# Patient Record
Sex: Male | Born: 1955 | Race: White | Hispanic: No | Marital: Married | Smoking: Former smoker
Health system: Southern US, Community
[De-identification: ages and names within clinical notes are randomized; demographics above are authoritative.]

## PROBLEM LIST (undated history)

## (undated) DIAGNOSIS — D649 Anemia, unspecified: Secondary | ICD-10-CM

## (undated) DIAGNOSIS — I2510 Atherosclerotic heart disease of native coronary artery without angina pectoris: Secondary | ICD-10-CM

## (undated) DIAGNOSIS — B353 Tinea pedis: Secondary | ICD-10-CM

## (undated) DIAGNOSIS — I5032 Chronic diastolic (congestive) heart failure: Secondary | ICD-10-CM

## (undated) DIAGNOSIS — I219 Acute myocardial infarction, unspecified: Secondary | ICD-10-CM

## (undated) DIAGNOSIS — I10 Essential (primary) hypertension: Secondary | ICD-10-CM

## (undated) DIAGNOSIS — R7303 Prediabetes: Secondary | ICD-10-CM

## (undated) HISTORY — DX: Prediabetes: R73.03

## (undated) HISTORY — DX: Atherosclerotic heart disease of native coronary artery without angina pectoris: I25.10

## (undated) HISTORY — DX: Anemia, unspecified: D64.9

## (undated) HISTORY — DX: Morbid (severe) obesity due to excess calories: E66.01

## (undated) HISTORY — DX: Chronic diastolic (congestive) heart failure: I50.32

---

## 2001-11-18 ENCOUNTER — Emergency Department (HOSPITAL_COMMUNITY): Admission: EM | Admit: 2001-11-18 | Discharge: 2001-11-18 | Payer: Self-pay | Admitting: Emergency Medicine

## 2005-06-03 ENCOUNTER — Ambulatory Visit: Payer: Self-pay | Admitting: Internal Medicine

## 2005-06-13 ENCOUNTER — Ambulatory Visit: Payer: Self-pay | Admitting: Internal Medicine

## 2005-06-13 ENCOUNTER — Encounter (INDEPENDENT_AMBULATORY_CARE_PROVIDER_SITE_OTHER): Payer: Self-pay | Admitting: Internal Medicine

## 2005-06-13 ENCOUNTER — Ambulatory Visit (HOSPITAL_COMMUNITY): Admission: RE | Admit: 2005-06-13 | Discharge: 2005-06-13 | Payer: Self-pay | Admitting: Internal Medicine

## 2007-07-29 ENCOUNTER — Ambulatory Visit (HOSPITAL_COMMUNITY): Admission: RE | Admit: 2007-07-29 | Discharge: 2007-07-29 | Payer: Self-pay | Admitting: Family Medicine

## 2009-10-31 ENCOUNTER — Emergency Department (HOSPITAL_COMMUNITY): Admission: EM | Admit: 2009-10-31 | Discharge: 2009-10-31 | Payer: Self-pay | Admitting: Emergency Medicine

## 2009-11-16 ENCOUNTER — Emergency Department (HOSPITAL_COMMUNITY): Admission: EM | Admit: 2009-11-16 | Discharge: 2009-11-16 | Payer: Self-pay | Admitting: Emergency Medicine

## 2010-08-30 NOTE — Consult Note (Signed)
NAME:  Dakota Odom, Dakota Odom                      ACCOUNT NO.:  [id number]   MEDICAL RECORD NO.:  [id number]          PATIENT TYPE:   LOCATION:                                 FACILITY:   PHYSICIAN:  Lionel December, M.D.    DATE OF BIRTH:  06/03/2005   DATE OF CONSULTATION:  DATE OF DISCHARGE:                                   CONSULTATION   REFERRING PHYSICIAN:  Mila Homer. Sudie Bailey, M.D.   HISTORY OF PRESENT ILLNESS:  Shigeru is a 55 year old, Caucasian gentleman  with a couple month history of intermittent hematochezia.  Recently, he was  having hematochezia on a daily basis for a couple of weeks.  He has not seen  any blood per rectum for a week, however.  He denies any constipation,  diarrhea, rectal pain or abdominal pain.  There is no nausea or vomiting.  He has intermittent heartburn generally once a month for which he takes  Tums.  He has never had a colonoscopy.  He recently had three negative stool  Hemoccult cards.   CURRENT MEDICATIONS:  1.  Chantix dose pack.  2.  Gemfibrozil 600 mg b.i.d.  3.  Vytorin 10/40 mg daily.  4.  Allegra 180 mg daily.  5.  Wellbutrin SR 150 mg b.i.d.  6.  Aspirin 81 mg daily.  7.  Tums p.r.n.   ALLERGIES:  No known drug allergies.   PAST MEDICAL HISTORY:  1.  Hypercholesterolemia.  2.  Seasonal allergies.  3.  Tobacco abuse.  4.  Left inguinal hernia repair.   FAMILY HISTORY:  Negative for colorectal cancer or chronic GI illnesses.   SOCIAL HISTORY:  He is married and has two children.  He is employed with  [employer].  He has been trying to quit smoking x2 years.  He use to  smoke four packs a day and is down to half pack a day.  He has not smoked  any in 24 hours.  No alcohol use.   REVIEW OF SYSTEMS:  GASTROINTESTINAL:  See HPI.  CONSTITUTIONAL:  No  unintentional weight loss.  CARDIOPULMONARY:  No chest pain or shortness of  breath.   PHYSICAL EXAMINATION:  VITAL SIGNS:  Weight 224 pounds, height 5 feet 6  inches, temperature 98.4, blood  pressure 132/80, pulse 78.  GENERAL:  Pleasant, moderately obese, Caucasian male in no acute distress.  SKIN:  Warm and dry, no jaundice.  HEENT:  Conjunctivae are pink.  Sclerae nonicteric.  Oropharyngeal mucosa  moist and pink.  No lesions, erythema or exudate.  No lymphadenopathy or  thyromegaly.  CHEST:  Lungs are clear to auscultation.  CARDIAC:  Regular rate and rhythm.  Normal S1, S2.  No murmurs, rubs or  gallops.  ABDOMEN:  Positive bowel sounds, obese, but symmetrical.  Soft.  Nontender.  No organomegaly or masses.  No rebound tenderness or guarding.  No abdominal  bruits or hernias.  EXTREMITIES:  No edema.   IMPRESSION:  Intermittent, chronic hematochezia possibly due to anorectal  source such as hemorrhoids.  He has never had a  colonoscopy and would  recommend diagnostic colonoscopy at this time.  I have discussed risks,  alternatives and benefits with the patient and he is agreeable to proceed.   RECOMMENDATIONS:  1.  Colonoscopy in the near future.  2.  Hold aspirin 4 days prior to procedure.      Tana Coast, P.A.      Lionel December, M.D.  Electronically Signed    LL/MEDQ  D:  06/03/2005  T:  [birth year]03/2005  Job:  [id number]   cc:   Mila Homer. Sudie Bailey, M.D.  Fax: [phone]   Lionel December, M.D.  [address]  [place]  [place] [address]

## 2010-08-30 NOTE — Op Note (Signed)
NAME:  Dakota Odom, Dakota Odom                  ACCOUNT NO.:  [id number]   MEDICAL RECORD NO.:  [id number]          PATIENT TYPE:  AMB   LOCATION:  DAY                           FACILITY:  [facility]   PHYSICIAN:  Lionel December, M.D.    DATE OF BIRTH:  [birth year]   DATE OF PROCEDURE:  06/13/2005  DATE OF DISCHARGE:                                 OPERATIVE REPORT   PROCEDURE:  Colonoscopy.   INDICATIONS:  Dakota Odom is a 55 year old Caucasian male with intermittent  hematochezia. It is possibly related to hemorrhoids, but since this is a  recurrent problem, he is undergoing this exam to make sure he does not have  a polyp or other lesion to account for his hematochezia. Procedure and risks  were reviewed with the patient, and informed consent was obtained.   MEDICINES FOR CONSCIOUS SEDATION:  Demerol 50 mg IV Versed 5 mg IV.   FINDINGS:  Procedure performed in endoscopy suite. The patient's vital signs  and O2 saturations were monitored during procedure and remained stable. The  patient was placed in left lateral recumbent position and rectal examination  performed. No abnormality noted on external or digital exam. Olympus  videoscope was placed into the rectum and advanced under vision into sigmoid  colon and beyond. Preparation was excellent. There is a small polyp at  proximal sigmoid colon which was ablated by cold biopsy.   Scope was passed into cecum which was identified by appendiceal orifice and  ileocecal valve. Pictures were taken for the record. As the scope was  withdrawn, colonic mucosa was, once again, carefully examined and was normal  throughout. Rectal mucosa similarly was normal. Scope was retroflexed to  examine anorectal junction and small hemorrhoids were noted below the  dentate line. Endoscope was straightened and withdrawn. The patient  tolerated the procedure well.   FINAL DIAGNOSIS:  1.  Small polyp ablated via cold biopsy from sigmoid colon.  2.  Small external hemorrhoids,  the possible source of intermittent      hematochezia.   RECOMMENDATIONS:  1.  High-fiber diet.  2.  I will be contacting the patient with biopsy results and further      recommendations if any.      Lionel December, M.D.  Electronically Signed     NR/MEDQ  D:  06/13/2005  T:  06/13/2005  Job:  [id number]   cc:   Mila Homer. Sudie Bailey, M.D.  Fax: [phone]

## 2011-11-06 IMAGING — CR DG HAND COMPLETE 3+V*L*
3 series · 3 of 3 positions shown · non-contrast
Comparison: None.

CLINICAL DATA: Evaluate for foreign body

LEFT HAND - COMPLETE 3+ VIEW

[view not recorded (1 of 3)]
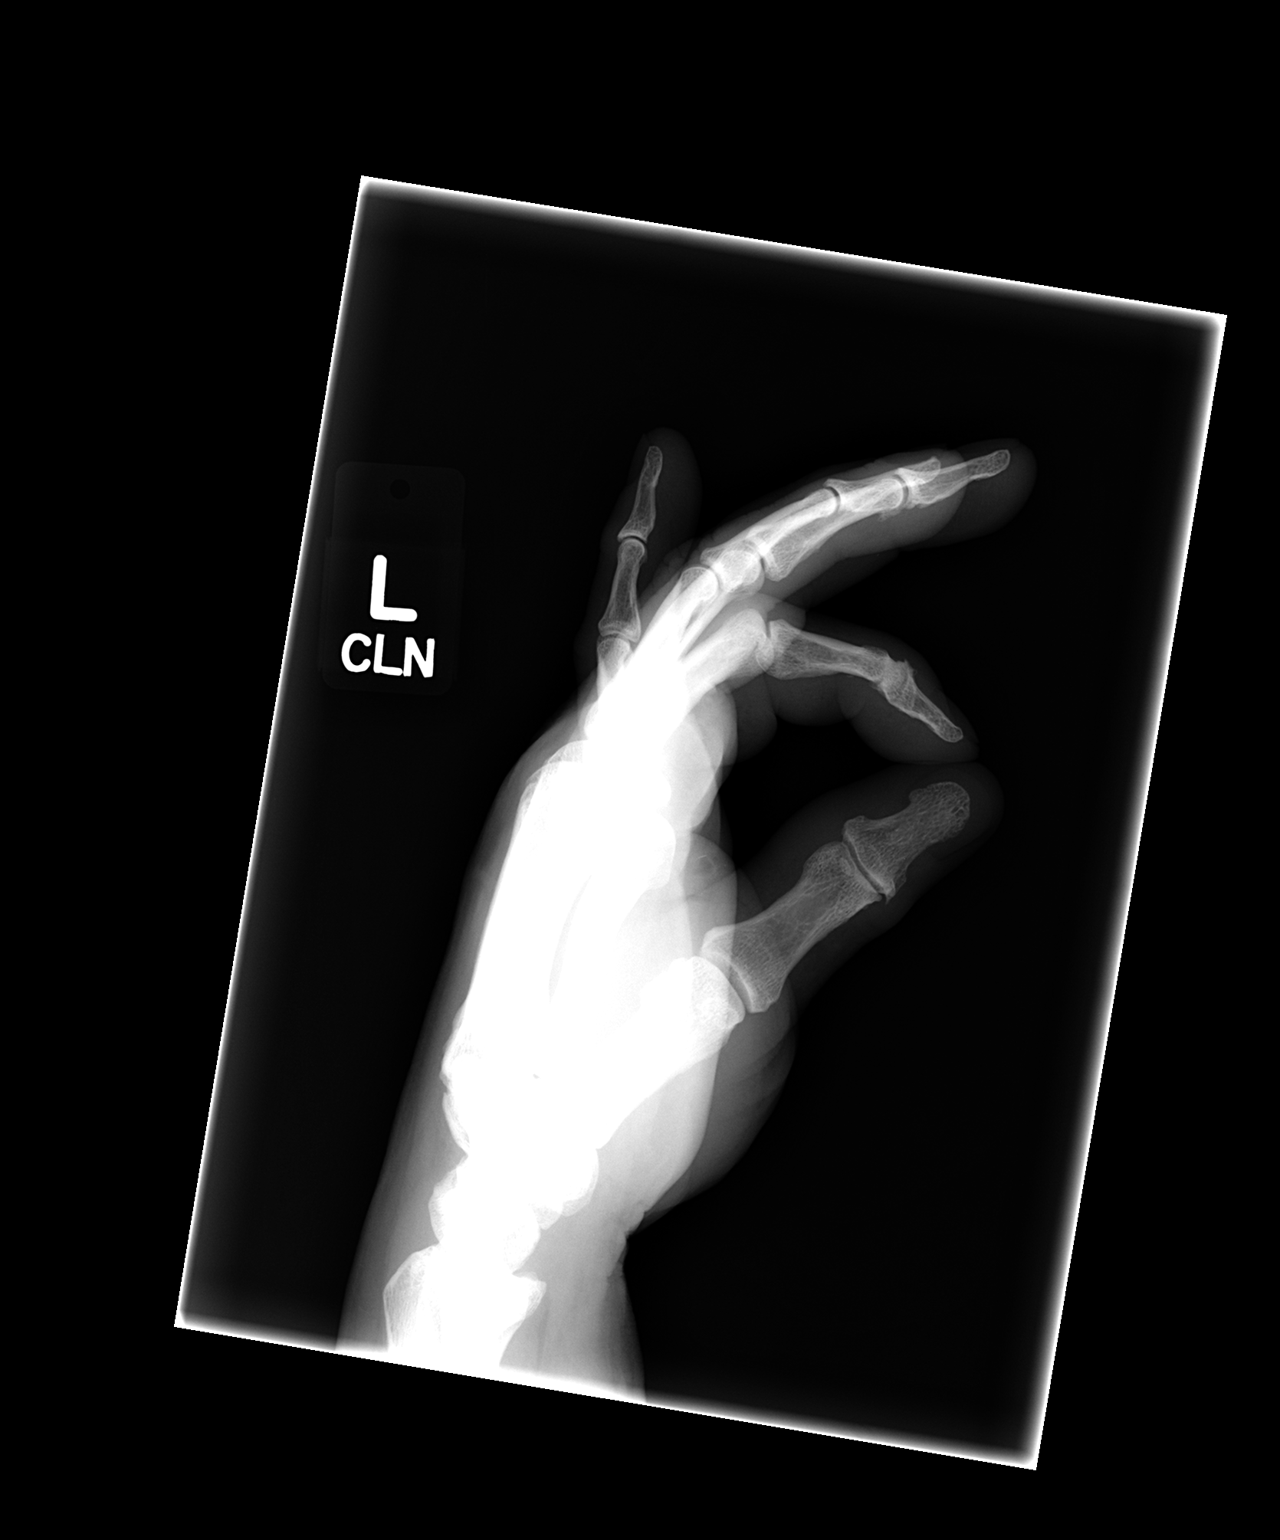

[view not recorded (2 of 3)]
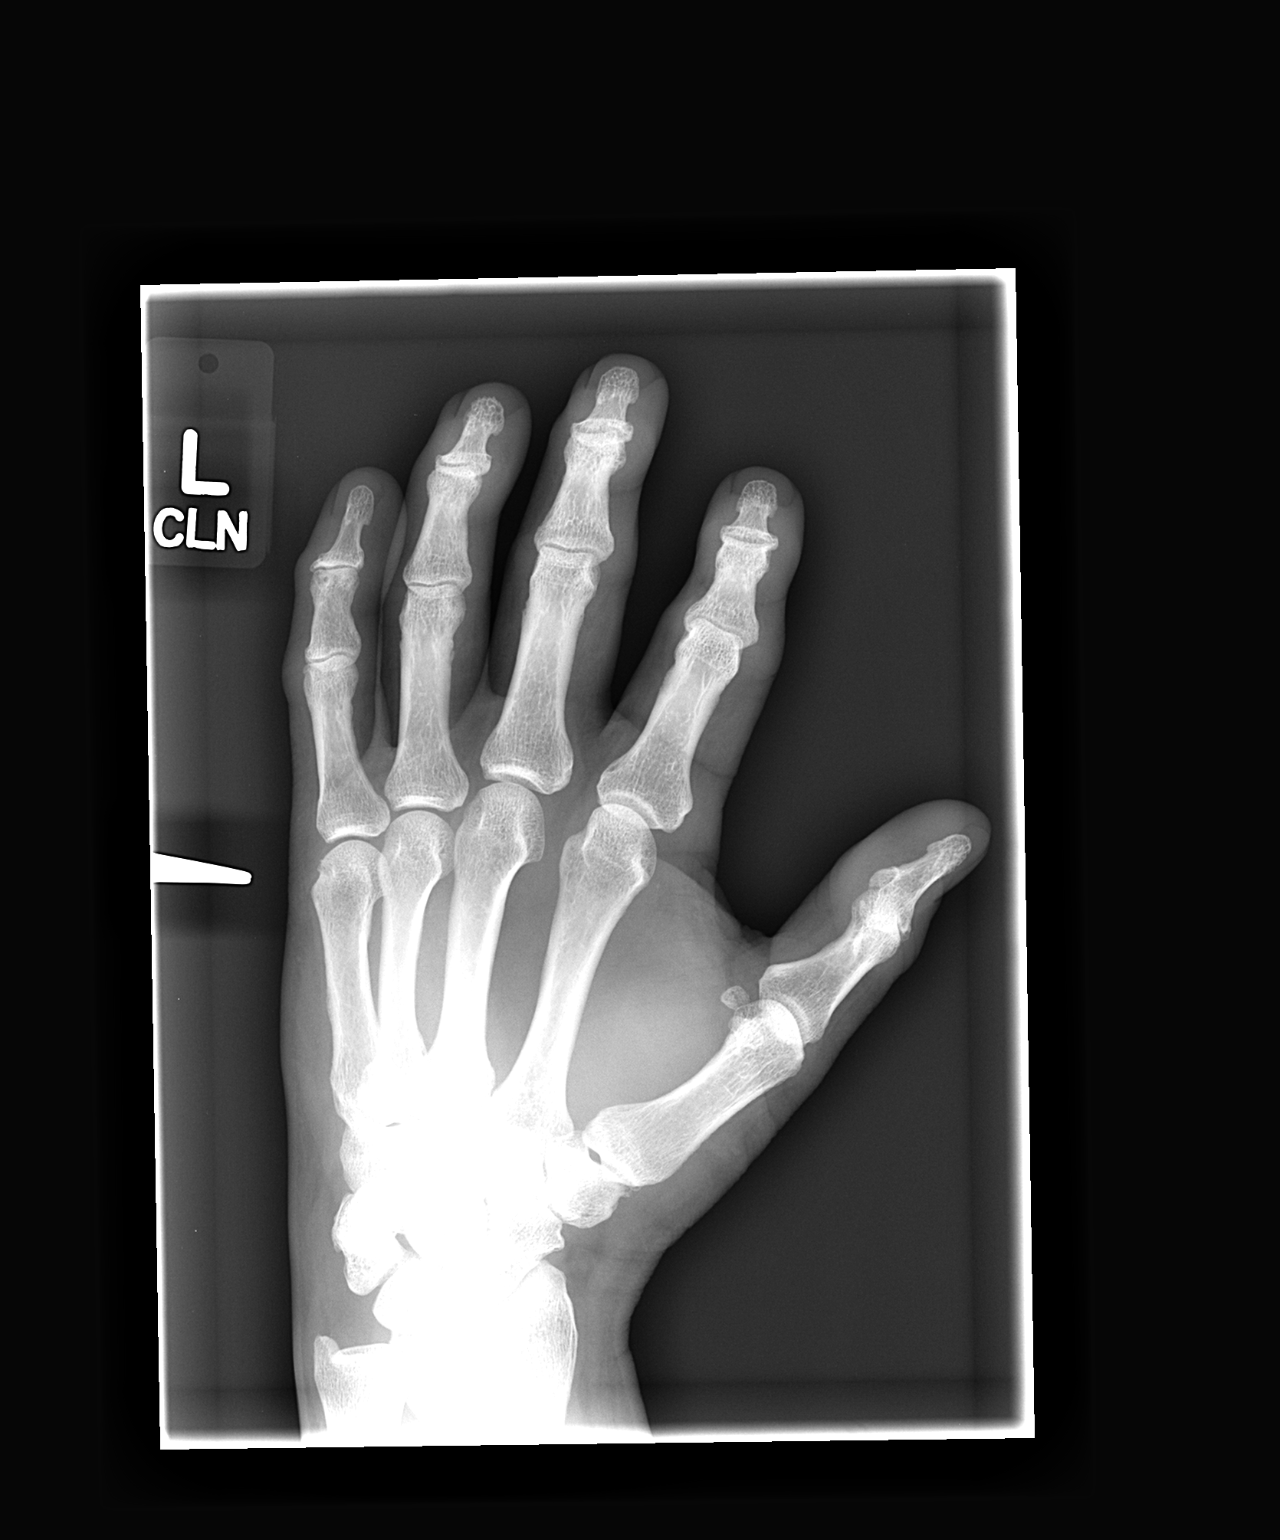

[view not recorded (3 of 3)]
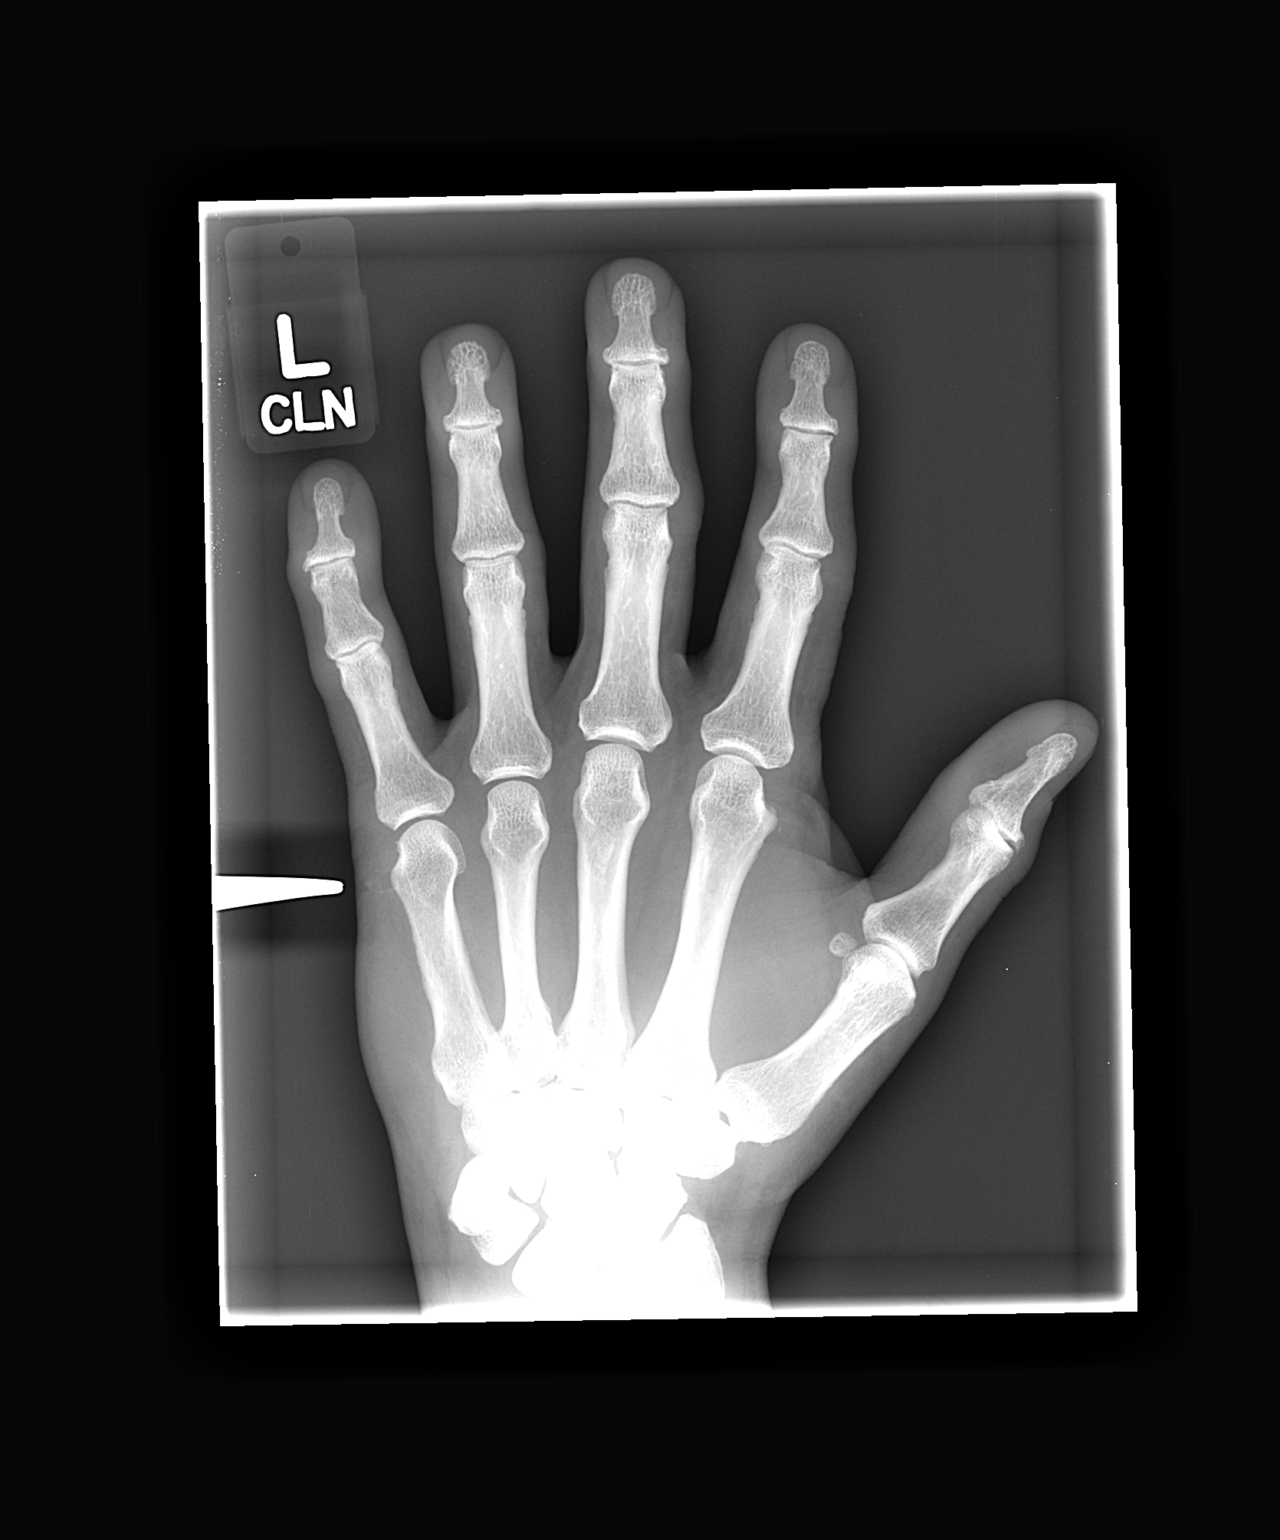

[3 of 3 positions shown; findings below may reference images not displayed]

FINDINGS: There is no acute fracture or malalignment.  A soft
tissue defect is seen projecting over the lateral aspect of the
fifth metacarpal head.  No radiopaque foreign body is seen.
IMPRESSION: Soft tissue defect overlying the fifth metacarpal without
radiopaque foreign body.

## 2012-06-11 ENCOUNTER — Encounter (INDEPENDENT_AMBULATORY_CARE_PROVIDER_SITE_OTHER): Payer: Self-pay | Admitting: *Deleted

## 2021-04-04 ENCOUNTER — Inpatient Hospital Stay (HOSPITAL_COMMUNITY)
Admission: EM | Admit: 2021-04-04 | Discharge: 2021-04-06 | DRG: 247 | Disposition: A | Discharge status: Home / Self Care | Payer: BC Managed Care – PPO | Attending: Cardiovascular Disease | Admitting: Cardiovascular Disease

## 2021-04-04 ENCOUNTER — Emergency Department (HOSPITAL_COMMUNITY): Payer: BC Managed Care – PPO

## 2021-04-04 ENCOUNTER — Other Ambulatory Visit: Payer: Self-pay

## 2021-04-04 ENCOUNTER — Encounter (HOSPITAL_COMMUNITY)
Admission: EM | Disposition: A | Discharge status: Home / Self Care | Payer: Self-pay | Source: Home / Self Care | Attending: Cardiovascular Disease

## 2021-04-04 ENCOUNTER — Other Ambulatory Visit (HOSPITAL_COMMUNITY): Payer: Self-pay

## 2021-04-04 ENCOUNTER — Inpatient Hospital Stay (HOSPITAL_COMMUNITY): Payer: BC Managed Care – PPO

## 2021-04-04 ENCOUNTER — Encounter (HOSPITAL_COMMUNITY): Payer: Self-pay

## 2021-04-04 DIAGNOSIS — Z955 Presence of coronary angioplasty implant and graft: Secondary | ICD-10-CM

## 2021-04-04 DIAGNOSIS — I959 Hypotension, unspecified: Secondary | ICD-10-CM | POA: Diagnosis not present

## 2021-04-04 DIAGNOSIS — R0689 Other abnormalities of breathing: Secondary | ICD-10-CM | POA: Diagnosis not present

## 2021-04-04 DIAGNOSIS — I2111 ST elevation (STEMI) myocardial infarction involving right coronary artery: Secondary | ICD-10-CM | POA: Diagnosis not present

## 2021-04-04 DIAGNOSIS — Z6841 Body Mass Index (BMI) 40.0 and over, adult: Secondary | ICD-10-CM | POA: Diagnosis not present

## 2021-04-04 DIAGNOSIS — F17210 Nicotine dependence, cigarettes, uncomplicated: Secondary | ICD-10-CM | POA: Diagnosis not present

## 2021-04-04 DIAGNOSIS — E785 Hyperlipidemia, unspecified: Secondary | ICD-10-CM | POA: Diagnosis present

## 2021-04-04 DIAGNOSIS — E669 Obesity, unspecified: Secondary | ICD-10-CM | POA: Diagnosis not present

## 2021-04-04 DIAGNOSIS — Z20822 Contact with and (suspected) exposure to covid-19: Secondary | ICD-10-CM | POA: Diagnosis present

## 2021-04-04 DIAGNOSIS — I213 ST elevation (STEMI) myocardial infarction of unspecified site: Secondary | ICD-10-CM | POA: Diagnosis present

## 2021-04-04 DIAGNOSIS — R7303 Prediabetes: Secondary | ICD-10-CM | POA: Diagnosis not present

## 2021-04-04 DIAGNOSIS — E782 Mixed hyperlipidemia: Secondary | ICD-10-CM | POA: Diagnosis not present

## 2021-04-04 DIAGNOSIS — I2510 Atherosclerotic heart disease of native coronary artery without angina pectoris: Secondary | ICD-10-CM | POA: Diagnosis not present

## 2021-04-04 DIAGNOSIS — R0902 Hypoxemia: Secondary | ICD-10-CM | POA: Diagnosis not present

## 2021-04-04 DIAGNOSIS — Z720 Tobacco use: Secondary | ICD-10-CM

## 2021-04-04 DIAGNOSIS — I10 Essential (primary) hypertension: Secondary | ICD-10-CM | POA: Diagnosis not present

## 2021-04-04 HISTORY — PX: CORONARY/GRAFT ACUTE MI REVASCULARIZATION: CATH118305

## 2021-04-04 HISTORY — DX: Essential (primary) hypertension: I10

## 2021-04-04 HISTORY — PX: LEFT HEART CATH AND CORONARY ANGIOGRAPHY: CATH118249

## 2021-04-04 LAB — COMPREHENSIVE METABOLIC PANEL
ALT: 38 U/L (ref 0–44)
AST: 53 U/L — ABNORMAL HIGH (ref 15–41)
Albumin: 3.6 g/dL (ref 3.5–5.0)
Alkaline Phosphatase: 70 U/L (ref 38–126)
Anion gap: 10 (ref 5–15)
BUN: 13 mg/dL (ref 8–23)
CO2: 25 mmol/L (ref 22–32)
Calcium: 9.1 mg/dL (ref 8.9–10.3)
Chloride: 103 mmol/L (ref 98–111)
Creatinine, Ser: 0.84 mg/dL (ref 0.61–1.24)
GFR, Estimated: >60 mL/min (ref >60)
Glucose, Bld: 159 mg/dL — ABNORMAL HIGH (ref 70–99)
Potassium: 3.3 mmol/L — ABNORMAL LOW (ref 3.5–5.1)
Sodium: 138 mmol/L (ref 135–145)
Total Bilirubin: 0.4 mg/dL (ref 0.3–1.2)
Total Protein: 7.3 g/dL (ref 6.5–8.1)

## 2021-04-04 LAB — CBC WITH DIFFERENTIAL/PLATELET
Abs Immature Granulocytes: 0.08 10*3/uL — ABNORMAL HIGH (ref 0.00–0.07)
Basophils Absolute: 0.1 10*3/uL (ref 0.0–0.1)
Basophils Relative: 1 %
Eosinophils Absolute: 0.3 10*3/uL (ref 0.0–0.5)
Eosinophils Relative: 2 %
HCT: 45.5 % (ref 39.0–52.0)
Hemoglobin: 14.4 g/dL (ref 13.0–17.0)
Immature Granulocytes: 1 %
Lymphocytes Relative: 28 %
Lymphs Abs: 3.2 10*3/uL (ref 0.7–4.0)
MCH: 26.4 pg (ref 26.0–34.0)
MCHC: 31.6 g/dL (ref 30.0–36.0)
MCV: 83.5 fL (ref 80.0–100.0)
Monocytes Absolute: 1.4 10*3/uL — ABNORMAL HIGH (ref 0.1–1.0)
Monocytes Relative: 12 %
Neutro Abs: 6.3 10*3/uL (ref 1.7–7.7)
Neutrophils Relative %: 56 %
Platelets: 322 10*3/uL (ref 150–400)
RBC: 5.45 MIL/uL (ref 4.22–5.81)
RDW: 14.9 % (ref 11.5–15.5)
WBC: 11.3 10*3/uL — ABNORMAL HIGH (ref 4.0–10.5)
nRBC: 0 % (ref 0.0–0.2)

## 2021-04-04 LAB — ECHOCARDIOGRAM COMPLETE
AR max vel: 2 cm2
AV Area VTI: 2.03 cm2
AV Area mean vel: 2.02 cm2
AV Mean grad: 6 mmHg
AV Peak grad: 11.6 mmHg
Ao pk vel: 1.7 m/s
Height: 66 in
S' Lateral: 3.4 cm
Weight: 4160 oz

## 2021-04-04 LAB — BASIC METABOLIC PANEL
Anion gap: 7 (ref 5–15)
BUN: 14 mg/dL (ref 8–23)
CO2: 24 mmol/L (ref 22–32)
Calcium: 8.6 mg/dL — ABNORMAL LOW (ref 8.9–10.3)
Chloride: 107 mmol/L (ref 98–111)
Creatinine, Ser: 0.93 mg/dL (ref 0.61–1.24)
GFR, Estimated: >60 mL/min (ref >60)
Glucose, Bld: 100 mg/dL — ABNORMAL HIGH (ref 70–99)
Potassium: 4.6 mmol/L (ref 3.5–5.1)
Sodium: 138 mmol/L (ref 135–145)

## 2021-04-04 LAB — RESP PANEL BY RT-PCR (FLU A&B, COVID) ARPGX2
Influenza A by PCR: NEGATIVE
Influenza B by PCR: NEGATIVE
SARS Coronavirus 2 by RT PCR: NEGATIVE

## 2021-04-04 LAB — LIPID PANEL
Cholesterol: 210 mg/dL — ABNORMAL HIGH (ref 0–200)
HDL: 41 mg/dL (ref >40)
LDL Cholesterol: 140 mg/dL — ABNORMAL HIGH (ref 0–99)
Total CHOL/HDL Ratio: 5.1 RATIO
Triglycerides: 143 mg/dL (ref <150)
VLDL: 29 mg/dL (ref 0–40)

## 2021-04-04 LAB — POCT ACTIVATED CLOTTING TIME
Activated Clotting Time: 311 seconds
Activated Clotting Time: 275 seconds

## 2021-04-04 LAB — TROPONIN I (HIGH SENSITIVITY)
Troponin I (High Sensitivity): 1788 ng/L (ref <18)
Troponin I (High Sensitivity): 16238 ng/L (ref <18)

## 2021-04-04 LAB — PROTIME-INR
INR: 1 (ref 0.8–1.2)
Prothrombin Time: 13.1 seconds (ref 11.4–15.2)

## 2021-04-04 LAB — MAGNESIUM
Magnesium: 2 mg/dL (ref 1.7–2.4)
Magnesium: 2 mg/dL (ref 1.7–2.4)

## 2021-04-04 LAB — APTT: aPTT: 29 seconds (ref 24–36)

## 2021-04-04 LAB — TSH: TSH: 1.131 u[IU]/mL (ref 0.350–4.500)

## 2021-04-04 LAB — HIV ANTIBODY (ROUTINE TESTING W REFLEX): HIV Screen 4th Generation wRfx: NONREACTIVE

## 2021-04-04 LAB — HEMOGLOBIN A1C
Hgb A1c MFr Bld: 6.4 % — ABNORMAL HIGH (ref 4.8–5.6)
Hgb A1c MFr Bld: 6.3 % — ABNORMAL HIGH (ref 4.8–5.6)
Mean Plasma Glucose: 136.98 mg/dL
Mean Plasma Glucose: 134.11 mg/dL

## 2021-04-04 LAB — MRSA NEXT GEN BY PCR, NASAL: MRSA by PCR Next Gen: NOT DETECTED

## 2021-04-04 LAB — GLUCOSE, CAPILLARY: Glucose-Capillary: 177 mg/dL — ABNORMAL HIGH (ref 70–99)

## 2021-04-04 SURGERY — CORONARY/GRAFT ACUTE MI REVASCULARIZATION
Anesthesia: LOCAL

## 2021-04-04 MED ORDER — HEPARIN SODIUM (PORCINE) 1000 UNIT/ML IJ SOLN
INTRAMUSCULAR | Status: AC
  Filled 2021-04-04: qty 10

## 2021-04-04 MED ORDER — TICAGRELOR 90 MG PO TABS
90.0000 mg | ORAL_TABLET | Freq: Two times a day (BID) | ORAL | Status: DC
  Administered 2021-04-04 – 2021-04-06 (×4): 90 mg via ORAL
  Filled 2021-04-04 (×4): qty 1

## 2021-04-04 MED ORDER — HYDROMORPHONE HCL 1 MG/ML IJ SOLN
0.5000 mg | Freq: Once | INTRAMUSCULAR | Status: AC
  Administered 2021-04-04: 04:00:00 0.5 mg via INTRAVENOUS
  Filled 2021-04-04: qty 1

## 2021-04-04 MED ORDER — ONDANSETRON HCL 4 MG/2ML IJ SOLN
INTRAMUSCULAR | Status: DC | PRN
  Administered 2021-04-04 (×2): 4 mg via INTRAVENOUS

## 2021-04-04 MED ORDER — HYDRALAZINE HCL 20 MG/ML IJ SOLN: 10.0000 mg | INTRAMUSCULAR | Status: AC | PRN

## 2021-04-04 MED ORDER — SODIUM CHLORIDE 0.9 % IV SOLN
INTRAVENOUS | Status: AC | PRN
  Administered 2021-04-04: 10 mL/h via INTRAVENOUS

## 2021-04-04 MED ORDER — ONDANSETRON HCL 4 MG/2ML IJ SOLN
INTRAMUSCULAR | Status: AC
  Filled 2021-04-04: qty 2

## 2021-04-04 MED ORDER — SODIUM CHLORIDE 0.9% FLUSH
3.0000 mL | Freq: Two times a day (BID) | INTRAVENOUS | Status: DC
  Administered 2021-04-04 – 2021-04-05 (×4): 3 mL via INTRAVENOUS

## 2021-04-04 MED ORDER — ASPIRIN 81 MG PO CHEW
81.0000 mg | CHEWABLE_TABLET | Freq: Every day | ORAL | Status: DC
  Administered 2021-04-04 – 2021-04-06 (×3): 81 mg via ORAL
  Filled 2021-04-04 (×3): qty 1

## 2021-04-04 MED ORDER — ACETAMINOPHEN 325 MG PO TABS: 650.0000 mg | ORAL_TABLET | ORAL | Status: DC | PRN

## 2021-04-04 MED ORDER — SODIUM CHLORIDE 0.9 % IV SOLN: INTRAVENOUS | Status: AC

## 2021-04-04 MED ORDER — ONDANSETRON HCL 4 MG/2ML IJ SOLN: 4.0000 mg | Freq: Four times a day (QID) | INTRAMUSCULAR | Status: DC | PRN

## 2021-04-04 MED ORDER — HEPARIN (PORCINE) IN NACL 1000-0.9 UT/500ML-% IV SOLN
INTRAVENOUS | Status: DC | PRN
  Administered 2021-04-04 (×4): 500 mL

## 2021-04-04 MED ORDER — ATORVASTATIN CALCIUM 80 MG PO TABS
80.0000 mg | ORAL_TABLET | Freq: Every day | ORAL | Status: DC
  Administered 2021-04-04 – 2021-04-06 (×3): 80 mg via ORAL
  Filled 2021-04-04 (×3): qty 1

## 2021-04-04 MED ORDER — CANGRELOR BOLUS VIA INFUSION
INTRAVENOUS | Status: DC | PRN
  Administered 2021-04-04: 06:00:00 3537 ug via INTRAVENOUS

## 2021-04-04 MED ORDER — ASPIRIN 81 MG PO CHEW
324.0000 mg | CHEWABLE_TABLET | Freq: Once | ORAL | Status: AC
  Administered 2021-04-04: 04:00:00 324 mg via ORAL
  Filled 2021-04-04: qty 4

## 2021-04-04 MED ORDER — SODIUM CHLORIDE 0.9 % IV SOLN: 250.0000 mL | INTRAVENOUS | Status: DC | PRN

## 2021-04-04 MED ORDER — LIDOCAINE HCL (PF) 1 % IJ SOLN
INTRAMUSCULAR | Status: DC | PRN
  Administered 2021-04-04: 2 mL

## 2021-04-04 MED ORDER — ASPIRIN EC 81 MG PO TBEC
81.0000 mg | DELAYED_RELEASE_TABLET | Freq: Every day | ORAL | Status: DC
  Filled 2021-04-04: qty 1

## 2021-04-04 MED ORDER — NOREPINEPHRINE BITARTRATE 1 MG/ML IV SOLN
INTRAVENOUS | Status: AC | PRN
  Administered 2021-04-04: 06:00:00 5 ug/min via INTRAVENOUS

## 2021-04-04 MED ORDER — SODIUM CHLORIDE 0.9 % IV SOLN
4.0000 ug/kg/min | INTRAVENOUS | Status: DC
  Administered 2021-04-04: 08:00:00 4 ug/kg/min via INTRAVENOUS
  Filled 2021-04-04 (×2): qty 50

## 2021-04-04 MED ORDER — SODIUM CHLORIDE 0.9% FLUSH: 3.0000 mL | INTRAVENOUS | Status: DC | PRN

## 2021-04-04 MED ORDER — TICAGRELOR 90 MG PO TABS
180.0000 mg | ORAL_TABLET | Freq: Once | ORAL | Status: AC
  Administered 2021-04-04: 09:00:00 180 mg via ORAL
  Filled 2021-04-04: qty 2

## 2021-04-04 MED ORDER — NITROGLYCERIN 0.4 MG SL SUBL: 0.4000 mg | SUBLINGUAL_TABLET | SUBLINGUAL | Status: DC | PRN

## 2021-04-04 MED ORDER — SODIUM CHLORIDE 0.9 % IV SOLN
INTRAVENOUS | Status: AC | PRN
  Administered 2021-04-04: 06:00:00 4 ug/kg/min via INTRAVENOUS

## 2021-04-04 MED ORDER — MORPHINE SULFATE (PF) 2 MG/ML IV SOLN: 2.0000 mg | INTRAVENOUS | Status: DC | PRN

## 2021-04-04 MED ORDER — VERAPAMIL HCL 2.5 MG/ML IV SOLN
INTRAVENOUS | Status: AC
  Filled 2021-04-04: qty 2

## 2021-04-04 MED ORDER — SODIUM CHLORIDE 0.9 % IV BOLUS
1000.0000 mL | Freq: Once | INTRAVENOUS | Status: AC
  Administered 2021-04-04: 04:00:00 1000 mL via INTRAVENOUS

## 2021-04-04 MED ORDER — VERAPAMIL HCL 2.5 MG/ML IV SOLN
INTRA_ARTERIAL | Status: DC | PRN
  Administered 2021-04-04: 05:00:00 10 mL via INTRA_ARTERIAL

## 2021-04-04 MED ORDER — SODIUM CHLORIDE 0.9 % IV SOLN: INTRAVENOUS | Status: DC

## 2021-04-04 MED ORDER — HEPARIN (PORCINE) IN NACL 1000-0.9 UT/500ML-% IV SOLN
INTRAVENOUS | Status: AC
  Filled 2021-04-04: qty 1000

## 2021-04-04 MED ORDER — LABETALOL HCL 5 MG/ML IV SOLN: 10.0000 mg | INTRAVENOUS | Status: AC | PRN

## 2021-04-04 MED ORDER — NICOTINE 14 MG/24HR TD PT24
14.0000 mg | MEDICATED_PATCH | Freq: Every day | TRANSDERMAL | Status: DC
  Administered 2021-04-04 – 2021-04-06 (×3): 14 mg via TRANSDERMAL
  Filled 2021-04-04 (×3): qty 1

## 2021-04-04 MED ORDER — TICAGRELOR 90 MG PO TABS
ORAL_TABLET | ORAL | Status: AC
  Filled 2021-04-04: qty 1

## 2021-04-04 MED ORDER — CANGRELOR TETRASODIUM 50 MG IV SOLR
INTRAVENOUS | Status: AC
  Filled 2021-04-04: qty 50

## 2021-04-04 MED ORDER — TICAGRELOR 90 MG PO TABS: 90.0000 mg | ORAL_TABLET | Freq: Two times a day (BID) | ORAL | Status: DC

## 2021-04-04 MED ORDER — METOPROLOL SUCCINATE ER 25 MG PO TB24
25.0000 mg | ORAL_TABLET | Freq: Every day | ORAL | Status: DC
  Administered 2021-04-04 – 2021-04-06 (×3): 25 mg via ORAL
  Filled 2021-04-04 (×3): qty 1

## 2021-04-04 MED ORDER — TICAGRELOR 90 MG PO TABS
ORAL_TABLET | ORAL | Status: DC | PRN
  Administered 2021-04-04: 180 mg via ORAL

## 2021-04-04 MED ORDER — ALPRAZOLAM 0.5 MG PO TABS
0.5000 mg | ORAL_TABLET | Freq: Three times a day (TID) | ORAL | Status: DC | PRN
  Administered 2021-04-04 – 2021-04-06 (×3): 0.5 mg via ORAL
  Filled 2021-04-04 (×3): qty 1

## 2021-04-04 MED ORDER — POTASSIUM CHLORIDE CRYS ER 20 MEQ PO TBCR
40.0000 meq | EXTENDED_RELEASE_TABLET | Freq: Once | ORAL | Status: AC
  Administered 2021-04-04: 11:00:00 40 meq via ORAL
  Filled 2021-04-04: qty 2

## 2021-04-04 MED ORDER — HEPARIN SODIUM (PORCINE) 5000 UNIT/ML IJ SOLN
4000.0000 [IU] | Freq: Once | INTRAMUSCULAR | Status: AC
  Administered 2021-04-04: 04:00:00 4000 [IU] via INTRAVENOUS
  Filled 2021-04-04: qty 1

## 2021-04-04 MED ORDER — LIDOCAINE HCL (PF) 1 % IJ SOLN
INTRAMUSCULAR | Status: AC
  Filled 2021-04-04: qty 30

## 2021-04-04 MED ORDER — ATROPINE SULFATE 1 MG/10ML IJ SOSY
PREFILLED_SYRINGE | INTRAMUSCULAR | Status: DC | PRN
  Administered 2021-04-04 (×2): .5 mg via INTRAVENOUS

## 2021-04-04 MED ORDER — NOREPINEPHRINE 4 MG/250ML-% IV SOLN
INTRAVENOUS | Status: AC
  Filled 2021-04-04: qty 250

## 2021-04-04 MED ORDER — CHLORHEXIDINE GLUCONATE CLOTH 2 % EX PADS
6.0000 | MEDICATED_PAD | Freq: Every day | CUTANEOUS | Status: DC
  Administered 2021-04-04 – 2021-04-05 (×2): 6 via TOPICAL

## 2021-04-04 MED ORDER — HEPARIN SODIUM (PORCINE) 1000 UNIT/ML IJ SOLN
INTRAMUSCULAR | Status: DC | PRN
  Administered 2021-04-04: 12000 [IU] via INTRAVENOUS

## 2021-04-04 MED ORDER — IOHEXOL 350 MG/ML SOLN
INTRAVENOUS | Status: DC | PRN
  Administered 2021-04-04: 06:00:00 150 mL via INTRA_ARTERIAL

## 2021-04-04 SURGICAL SUPPLY — 20 items
BALLN SAPPHIRE 2.0X12 (BALLOONS) ×3
BALLN SAPPHIRE ~~LOC~~ 3.0X15 (BALLOONS) ×2 IMPLANT
BALLN SAPPHIRE ~~LOC~~ 4.0X10 (BALLOONS) ×2 IMPLANT
BALLOON SAPPHIRE 2.0X12 (BALLOONS) IMPLANT
CATH LAUNCHER 6FR JR4 (CATHETERS) ×2 IMPLANT
CATH OPTITORQUE TIG 4.0 5F (CATHETERS) ×2 IMPLANT
DEVICE RAD COMP TR BAND LRG (VASCULAR PRODUCTS) ×2 IMPLANT
GLIDESHEATH SLEND A-KIT 6F 22G (SHEATH) ×2 IMPLANT
GUIDEWIRE INQWIRE 1.5J.035X260 (WIRE) IMPLANT
INQWIRE 1.5J .035X260CM (WIRE) ×3
KIT ENCORE 26 ADVANTAGE (KITS) ×2 IMPLANT
KIT HEART LEFT (KITS) ×3 IMPLANT
PACK CARDIAC CATHETERIZATION (CUSTOM PROCEDURE TRAY) ×3 IMPLANT
STENT ONYX FRONTIER 2.75X22 (Permanent Stent) ×2 IMPLANT
STENT ONYX FRONTIER 3.0X12 (Permanent Stent) ×2 IMPLANT
STENT ONYX FRONTIER 3.5X15 (Permanent Stent) ×2 IMPLANT
TRANSDUCER W/STOPCOCK (MISCELLANEOUS) ×3 IMPLANT
TUBING CIL FLEX 10 FLL-RA (TUBING) ×3 IMPLANT
WIRE ASAHI PROWATER 180CM (WIRE) ×2 IMPLANT
WIRE HI TORQ VERSACORE-J 145CM (WIRE) ×2 IMPLANT

## 2021-04-04 NOTE — ED Provider Notes (Signed)
65 yo M with chest pain woke him from sleep, found to have a STEMI on ecg, sent here for cath.  Patient feeling much better on arrival, had one episode of emesis enroute.   Cards at bedside at arrival, will take to the lab.    Melene Plan, DO 04/04/21 [phone]

## 2021-04-04 NOTE — TOC Benefit Eligibility Note (Signed)
Patient Product/process development scientist completed.    The patient is currently admitted and upon discharge could be taking Brilinta 90 mg.  The current 30 day co-pay is, $35.00.   The patient is insured through [employer] of [facility]     Roland Earl, CPhT Pharmacy Patient Advocate Specialist [facility] Patient Advocate Team Direct Number: [phone]  Fax: [phone]

## 2021-04-04 NOTE — Progress Notes (Signed)
Date and time results received: 04/04/21 0838 (use smartphrase ".now" to insert current time)  Test: Troponin Critical Value: 16,238  Name of Provider Notified: Dr. Allyson Sabal  Orders Received? Or Actions Taken?: Expected Value. No new orders.

## 2021-04-04 NOTE — ED Triage Notes (Signed)
Chest pain that started an hour ago center of chest

## 2021-04-04 NOTE — Progress Notes (Signed)
°  Echocardiogram 2D Echocardiogram has been performed.  Roosvelt Maser F 04/04/2021, 1:50 PM

## 2021-04-04 NOTE — ED Provider Notes (Signed)
[facility] Emergency Department Provider Note MRN:  [id number]  Arrival date & time: 04/04/21     Chief Complaint   Chest Pain   History of Present Illness   Dakota Odom is a 65 y.o. year-old male with a history of hypertension, obesity, tobacco abuse presenting to the ED with chief complaint of chest pain.  Severe chest pain starting 1 hour ago, associated with shortness of breath.  No cardiac history.  Denies drugs or alcohol.  Review of Systems  A complete 10 system review of systems was obtained and all systems are negative except as noted in the HPI and PMH.   Patient's Health History    Past Medical History:  Diagnosis Date   Hypertension       History reviewed. No pertinent family history.  Social History   Socioeconomic History   Marital status: Married    Spouse name: Not on file   Number of children: Not on file   Years of education: Not on file   Highest education level: Not on file  Occupational History   Not on file  Tobacco Use   Smoking status: Every Day    Packs/day: 0.50    Years: 20.00    Pack years: 10.00    Types: Cigarettes   Smokeless tobacco: Never  Substance and Sexual Activity   Alcohol use: Not on file   Drug use: Never   Sexual activity: Not on file  Other Topics Concern   Not on file  Social History Narrative   Not on file   Social Determinants of Health   Financial Resource Strain: Not on file  Food Insecurity: Not on file  Transportation Needs: Not on file  Physical Activity: Not on file  Stress: Not on file  Social Connections: Not on file  Intimate Partner Violence: Not on file     Physical Exam   Vitals:   04/04/21 0400 04/04/21 0402  BP: 105/76 105/76  Pulse: 89 79  Resp: 18 20  Temp: 98.2 F (36.8 C)   SpO2: 98% 94%    CONSTITUTIONAL: Ill-appearing, in moderate distress due to pain NEURO:  Alert and oriented x 3, no focal deficits EYES:  eyes equal and reactive ENT/NECK:  no LAD,  no JVD CARDIO: Regular rate, well-perfused, normal S1 and S2 PULM:  CTAB no wheezing or rhonchi GI/GU:  normal bowel sounds, non-distended, non-tender MSK/SPINE:  No gross deformities, no edema SKIN:  no rash, atraumatic PSYCH:  Appropriate speech and behavior  *Additional and/or pertinent findings included in MDM below  Diagnostic and Interventional Summary    EKG Interpretation  Date/Time:  Thursday April 04 2021 03:59:03 EST Ventricular Rate:  87 PR Interval:  212 QRS Duration: 107 QT Interval:  377 QTC Calculation: 454 R Axis:   73 Text Interpretation: Sinus rhythm Atrial premature complex Borderline prolonged PR interval Inferior infarct, acute (RCA) Probable anterolateral infarct, acute Probable RV involvement, suggest recording right precordial leads Baseline wander in lead(s) I III aVR aVL >>> Acute MI <<< No old tracing to compare Confirmed by Dione Booze ([id number]) on 04/04/2021 4:25:58 AM       Labs Reviewed  CBC WITH DIFFERENTIAL/PLATELET - Abnormal; Notable for the following components:      Result Value   WBC 11.3 (*)    Monocytes Absolute 1.4 (*)    Abs Immature Granulocytes 0.08 (*)    All other components within normal limits  RESP PANEL BY RT-PCR (FLU A&B, COVID) ARPGX2  HEMOGLOBIN A1C  PROTIME-INR  APTT  COMPREHENSIVE METABOLIC PANEL  LIPID PANEL  TROPONIN I (HIGH SENSITIVITY)    DG Chest Port 1 View    (Results Pending)    Medications  0.9 %  sodium chloride infusion ( Intravenous New Bag/Given 04/04/21 0416)  aspirin chewable tablet 324 mg (324 mg Oral Given 04/04/21 0404)  heparin injection 4,000 Units (4,000 Units Intravenous Given 04/04/21 0410)  HYDROmorphone (DILAUDID) injection 0.5 mg (0.5 mg Intravenous Given 04/04/21 0404)  sodium chloride 0.9 % bolus 1,000 mL (1,000 mLs Intravenous New Bag/Given 04/04/21 0409)     Procedures  /  Critical Care .Critical Care Performed by: Sabas Sous, MD Authorized by: Sabas Sous, MD    Critical care provider statement:    Critical care time (minutes):  40   Critical care was necessary to treat or prevent imminent or life-threatening deterioration of the following conditions: ST elevation myocardial infarction.   Critical care was time spent personally by me on the following activities:  Development of treatment plan with patient or surrogate, discussions with consultants, evaluation of patient's response to treatment, examination of patient, ordering and review of laboratory studies, ordering and review of radiographic studies, ordering and performing treatments and interventions, pulse oximetry, re-evaluation of patient's condition and review of old charts  ED Course and Medical Decision Making  I have reviewed the triage vital signs, the nursing notes, and pertinent available records from the EMR.  Listed above are laboratory and imaging tests that I personally ordered, reviewed, and interpreted and then considered in my medical decision making (see below for details).  Chest pain, multiple cardiovascular risk factors but no known history of CAD.  EKG with marked ST elevation inferiorly with reciprocal depression consistent with acute inferior MI.  Code STEMI initiated.  Given aspirin, heparin, Dilaudid for pain.  Heart rate in the 70s, blood pressure 107/76, will hold off on nitroglycerin for now discussed with Dr. Gery Pray of cardiology, who accepts patient urgently for Cath Lab.  Reached out to [facility] emergency department, and Dr. Preston Fleeting accepts patient for ED to ED transfer in case Cath Lab is not ready for patient upon arrival.       Elmer Sow. Pilar Plate, MD [facility] Emergency Medicine [facility] [email or web address]  Final Clinical Impressions(s) / ED Diagnoses     ICD-10-CM   1. ST elevation myocardial infarction (STEMI), unspecified artery (HCC)  I21.3       ED Discharge Orders     None        Discharge Instructions Discussed with and  Provided to Patient:   Discharge Instructions   None       Sabas Sous, MD 04/04/21 [phone]

## 2021-04-04 NOTE — H&P (Signed)
Cardiology Admission History and Physical:   Patient ID: Dakota Odom MRN: [id number]; DOB: Aug 27, 1955   Admission date: 04/04/2021  PCP:  Pcp, No   [facility] Providers Cardiologist:  None        Chief Complaint:  CP/STEMI  Patient Profile:   Dakota Odom is a 65 y.o. male with no significant prior cardiac hx who is being seen 04/04/2021 for the evaluation of CP and EKG changes suggestive of STEMI.  History of Present Illness:   Dakota Odom has no prior cardiac hx but has h/o HTN, obesity and tobacco abuse and presented to the ED at [facility] c/o CP that has been going on now for about 2-3 hours. Onset was sudden, random and woke him from sleep, as a discomfort or heaviness in the center of his chest that was severe and unrelenting. It was associated with SOB. Pt states he has not seen a doctor "in years" and takes no medications at home. EKG obtained by the [facility] shows >54mm ST elevation in the inferior leads w/ reciprocal depression laterally. STEMI activated and pt transferred to [facility] for urgent/emergent LHC.   Past Medical History:  Diagnosis Date   Hypertension     Medications Prior to Admission: Prior to Admission medications   Not on File   No active OP meds  Allergies:   No Known Allergies  Social History:   Social History   Socioeconomic History   Marital status: Married    Spouse name: Not on file   Number of children: Not on file   Years of education: Not on file   Highest education level: Not on file  Occupational History   Not on file  Tobacco Use   Smoking status: Every Day    Packs/day: 0.50    Years: 20.00    Pack years: 10.00    Types: Cigarettes   Smokeless tobacco: Never  Substance and Sexual Activity   Alcohol use: Not on file   Drug use: Never   Sexual activity: Not on file  Other Topics Concern   Not on file  Social History Narrative   Not on file   Social Determinants of Health   Financial Resource Strain: Not on file  Food Insecurity:  Not on file  Transportation Needs: Not on file  Physical Activity: Not on file  Stress: Not on file  Social Connections: Not on file  Intimate Partner Violence: Not on file    Family History:  non-contributory The patient's family history is not on file.    ROS:  Please see the history of present illness.  All other ROS reviewed and negative.     Physical Exam/Data:   Vitals:   04/04/21 0400 04/04/21 0402  BP: 105/76 105/76  Pulse: 89 79  Resp: 18 20  Temp: 98.2 F (36.8 C)   TempSrc: Oral   SpO2: 98% 94%  Weight:  117.9 kg  Height:  5\' 6"  (1.676 m)   No intake or output data in the 24 hours ending 04/04/21 0441 Last 3 Weights 04/04/2021  Weight (lbs) 260 lb  Weight (kg) 117.935 kg     Body mass index is 41.97 kg/m.  General:  Well nourished, well developed, in no acute distress HEENT: normal Neck: no JVD Vascular: No carotid bruits; Distal pulses 2+ bilaterally   Cardiac:  normal S1, S2; RRR; no murmur  Lungs:  clear to auscultation bilaterally, no wheezing, rhonchi or rales  Abd: soft, nontender, no hepatomegaly  Ext: no  edema Musculoskeletal:  No deformities Skin: warm and dry  Neuro:  no focal abnormalities noted Psych:  Normal affect    EKG:  The ECG that was done 04-04-21 was personally reviewed and demonstrates NSR with significant >28mm ST elevation in the inferior leads w/ reciprocal lateral ST depression  Relevant CV Studies: none  Laboratory Data:  High Sensitivity Troponin:  No results for input(s): TROPONINIHS in the last 720 hours.    ChemistryNo results for input(s): NA, K, CL, CO2, GLUCOSE, BUN, CREATININE, CALCIUM, MG, GFRNONAA, GFRAA, ANIONGAP in the last 168 hours.  No results for input(s): PROT, ALBUMIN, AST, ALT, ALKPHOS, BILITOT in the last 168 hours. Lipids No results for input(s): CHOL, TRIG, HDL, LABVLDL, LDLCALC, CHOLHDL in the last 168 hours. Hematology Recent Labs  Lab 04/04/21 0400  WBC 11.3*  RBC 5.45  HGB 14.4  HCT 45.5   MCV 83.5  MCH 26.4  MCHC 31.6  RDW 14.9  PLT 322   Thyroid No results for input(s): TSH, FREET4 in the last 168 hours. BNPNo results for input(s): BNP, PROBNP in the last 168 hours.  DDimer No results for input(s): DDIMER in the last 168 hours.   Radiology/Studies:  DG Chest Port 1 View  Result Date: 04/04/2021 CLINICAL DATA:  65 year old male, STEMI. EXAM: PORTABLE CHEST 1 VIEW COMPARISON:  None. FINDINGS: Portable AP upright view at 0412 hours. Pacer or resuscitation pads project over the left lower chest. Normal lung volumes. Cardiac size at the upper limits of normal. Other mediastinal contours are within normal limits. Visualized tracheal air column is within normal limits. No pneumothorax, pulmonary edema, pleural effusion or consolidation. No acute osseous abnormality identified. IMPRESSION: No radiographic cardiopulmonary abnormality. Electronically Signed   By: Odessa Fleming M.D.   On: 04/04/2021 04:37     Assessment and Plan:   CP/inferior STEMI: emergent LHC for further evaluation. Start medical tx w/ ASA, statin, BB. TTE for baseline. A1c, FLP, TSH for risk stratification HTN: unclear home regimen. Will start BB and add additional agents as necessary   Risk Assessment/Risk Scores:    TIMI Risk Score for ST  Elevation MI:   The patient's TIMI risk score is 2, which indicates a 2.2% risk of all cause mortality at 30 days.        Severity of Illness: The appropriate patient status for this patient is INPATIENT. Inpatient status is judged to be reasonable and necessary in order to provide the required intensity of service to ensure the patient's safety. The patient's presenting symptoms, physical exam findings, and initial radiographic and laboratory data in the context of their chronic comorbidities is felt to place them at high risk for further clinical deterioration. Furthermore, it is not anticipated that the patient will be medically stable for discharge from the hospital  within 2 midnights of admission.   * I certify that at the point of admission it is my clinical judgment that the patient will require inpatient hospital care spanning beyond 2 midnights from the point of admission due to high intensity of service, high risk for further deterioration and high frequency of surveillance required.*   For questions or updates, please contact [facility] Please consult [email or web address] for contact info under     Signed, Precious Reel, MD, [facility] 04/04/2021 4:41 AM

## 2021-04-04 NOTE — ED Notes (Signed)
Called [facility] STEMI for transport

## 2021-04-04 NOTE — ED Notes (Signed)
Called [facility]

## 2021-04-04 NOTE — ED Notes (Signed)
Called report to [facility]

## 2021-04-05 ENCOUNTER — Other Ambulatory Visit (HOSPITAL_COMMUNITY): Payer: Self-pay

## 2021-04-05 ENCOUNTER — Encounter (HOSPITAL_COMMUNITY): Payer: Self-pay | Admitting: Cardiovascular Disease

## 2021-04-05 DIAGNOSIS — I10 Essential (primary) hypertension: Secondary | ICD-10-CM

## 2021-04-05 DIAGNOSIS — E782 Mixed hyperlipidemia: Secondary | ICD-10-CM

## 2021-04-05 LAB — BASIC METABOLIC PANEL
Anion gap: 5 (ref 5–15)
BUN: 14 mg/dL (ref 8–23)
CO2: 28 mmol/L (ref 22–32)
Calcium: 8.7 mg/dL — ABNORMAL LOW (ref 8.9–10.3)
Chloride: 105 mmol/L (ref 98–111)
Creatinine, Ser: 0.94 mg/dL (ref 0.61–1.24)
GFR, Estimated: >60 mL/min (ref >60)
Glucose, Bld: 118 mg/dL — ABNORMAL HIGH (ref 70–99)
Potassium: 4.7 mmol/L (ref 3.5–5.1)
Sodium: 138 mmol/L (ref 135–145)

## 2021-04-05 LAB — CBC
HCT: 41.7 % (ref 39.0–52.0)
Hemoglobin: 13.2 g/dL (ref 13.0–17.0)
MCH: 26.6 pg (ref 26.0–34.0)
MCHC: 31.7 g/dL (ref 30.0–36.0)
MCV: 83.9 fL (ref 80.0–100.0)
Platelets: 244 10*3/uL (ref 150–400)
RBC: 4.97 MIL/uL (ref 4.22–5.81)
RDW: 15.2 % (ref 11.5–15.5)
WBC: 12.6 10*3/uL — ABNORMAL HIGH (ref 4.0–10.5)
nRBC: 0 % (ref 0.0–0.2)

## 2021-04-05 LAB — GLUCOSE, CAPILLARY: Glucose-Capillary: 140 mg/dL — ABNORMAL HIGH (ref 70–99)

## 2021-04-05 LAB — LIPID PANEL
Cholesterol: 180 mg/dL (ref 0–200)
HDL: 40 mg/dL — ABNORMAL LOW (ref >40)
LDL Cholesterol: 123 mg/dL — ABNORMAL HIGH (ref 0–99)
Total CHOL/HDL Ratio: 4.5 RATIO
Triglycerides: 85 mg/dL (ref <150)
VLDL: 17 mg/dL (ref 0–40)

## 2021-04-05 LAB — MAGNESIUM: Magnesium: 2.2 mg/dL (ref 1.7–2.4)

## 2021-04-05 MED ORDER — ATORVASTATIN CALCIUM 80 MG PO TABS
80.0000 mg | ORAL_TABLET | Freq: Every day | ORAL | 12 refills | Status: DC
  Filled 2021-04-05 (×2): qty 30, 30d supply, fill #0

## 2021-04-05 MED ORDER — TICAGRELOR 90 MG PO TABS
90.0000 mg | ORAL_TABLET | Freq: Two times a day (BID) | ORAL | 12 refills | Status: DC
  Filled 2021-04-05: qty 60, 30d supply, fill #0

## 2021-04-05 MED ORDER — EMPAGLIFLOZIN 10 MG PO TABS
10.0000 mg | ORAL_TABLET | Freq: Every day | ORAL | Status: DC
  Administered 2021-04-05 – 2021-04-06 (×2): 10 mg via ORAL
  Filled 2021-04-05 (×2): qty 1

## 2021-04-05 MED ORDER — METOPROLOL SUCCINATE ER 25 MG PO TB24
25.0000 mg | ORAL_TABLET | Freq: Every day | ORAL | 12 refills | Status: DC
  Filled 2021-04-05 (×2): qty 30, 30d supply, fill #0

## 2021-04-05 MED ORDER — EMPAGLIFLOZIN 10 MG PO TABS
10.0000 mg | ORAL_TABLET | Freq: Every day | ORAL | 12 refills | Status: DC
  Filled 2021-04-05: qty 30, 30d supply, fill #0

## 2021-04-05 NOTE — Care Management (Signed)
04-05-21 1146 Staff RN to provide patient with Brilinta co pay card. Case Manager will continue to follow for additional transition of care needs.

## 2021-04-05 NOTE — Progress Notes (Signed)
Progress Note  Patient Name: Dakota Odom Date of Encounter: 04/05/2021  [facility] Cardiologist: Dr. Quay Burow  Subjective   Postop day 1 inferior STEMI.  He is completely asymptomatic.  Inpatient Medications    Scheduled Meds:  aspirin  81 mg Oral Daily   atorvastatin  80 mg Oral Daily   Chlorhexidine Gluconate Cloth  6 each Topical Daily   metoprolol succinate  25 mg Oral Daily   nicotine  14 mg Transdermal Daily   sodium chloride flush  3 mL Intravenous Q12H   ticagrelor  90 mg Oral BID   Continuous Infusions:  sodium chloride 20 mL/hr at 04/04/21 0416   sodium chloride     PRN Meds: sodium chloride, acetaminophen, ALPRAZolam, morphine injection, nitroGLYCERIN, ondansetron (ZOFRAN) IV, sodium chloride flush   Vital Signs    Vitals:   04/05/21 0500 04/05/21 0600 04/05/21 0700 04/05/21 0702  BP: 110/88 (!) 120/96  115/82  Pulse: 63 84 95 97  Resp: 18 19 (!) 22 20  Temp:      TempSrc:      SpO2: 93% 94% 92% 94%  Weight:      Height:        Intake/Output Summary (Last 24 hours) at 04/05/2021 0819 Last data filed at 04/05/2021 4650 Gross per 24 hour  Intake 1390.76 ml  Output 1000 ml  Net 390.76 ml   Last 3 Weights 04/04/2021  Weight (lbs) 260 lb  Weight (kg) 117.935 kg      Telemetry    Sinus rhythm, sinus bradycardia with occasional junctional beats and occasional PVCs- Personally Reviewed  ECG    Sinus rhythm 86 with evolving inferior ST segment elevation with biphasic ST waves.- Personally Reviewed  Physical Exam   GEN: No acute distress.   Neck: No JVD Cardiac: RRR, no murmurs, rubs, or gallops.  Respiratory: Clear to auscultation bilaterally. GI: Soft, nontender, non-distended  MS: No edema; No deformity. Neuro:  Nonfocal  Psych: Normal affect   Labs    High Sensitivity Troponin:   Recent Labs  Lab 04/04/21 0400 04/04/21 0654  TROPONINIHS 1,788* 16,238*     Chemistry Recent Labs  Lab 04/04/21 0400 04/04/21 0654  04/04/21 1529 04/05/21 0229  NA 138  --  138 138  K 3.3*  --  4.6 4.7  CL 103  --  107 105  CO2 25  --  24 28  GLUCOSE 159*  --  100* 118*  BUN 13  --  14 14  CREATININE 0.84  --  0.93 0.94  CALCIUM 9.1  --  8.6* 8.7*  MG  --  2.0 2.0 2.2  PROT 7.3  --   --   --   ALBUMIN 3.6  --   --   --   AST 53*  --   --   --   ALT 38  --   --   --   ALKPHOS 70  --   --   --   BILITOT 0.4  --   --   --   GFRNONAA >60  --  >60 >60  ANIONGAP 10  --  7 5    Lipids  Recent Labs  Lab 04/05/21 0229  CHOL 180  TRIG 85  HDL 40*  LDLCALC 123*  CHOLHDL 4.5    Hematology Recent Labs  Lab 04/04/21 0400 04/05/21 0229  WBC 11.3* 12.6*  RBC 5.45 4.97  HGB 14.4 13.2  HCT 45.5 41.7  MCV 83.5 83.9  MCH 26.4 26.6  MCHC 31.6 31.7  RDW 14.9 15.2  PLT 322 244   Thyroid  Recent Labs  Lab 04/04/21 0654  TSH 1.131    BNPNo results for input(s): BNP, PROBNP in the last 168 hours.  DDimer No results for input(s): DDIMER in the last 168 hours.   Radiology    CARDIAC CATHETERIZATION  Result Date: 04/04/2021 Images from the original result were not included.   Ost LM to Mid LM lesion is 70% stenosed.   1st Mrg lesion is 50% stenosed.   1st Diag lesion is 50% stenosed.   Prox RCA lesion is 100% stenosed.   A drug-eluting stent was successfully placed using a STENT Dakota Odom.   Post intervention, there is a 0% residual stenosis. Dakota Odom is a 65 y.o. male  [id number] LOCATION:  FACILITY: [facility]dom PHYSICIAN: Quay Burow, M.D. [birth year] DATE OF PROCEDURE:  04/04/2021 DATE OF DISCHARGE: CARDIAC CATHETERIZATION / PCI DES RCA History obtained from chart review.  Dakota Odom is a 65 year old morbidly overweight married Caucasian male without prior cardiac history who was awakened in the early morning hours with chest pain.  He went to [facility] and was found to have inferior ST segment elevation and was transported urgently to [facility] for cath and intervention. PROCEDURE  DESCRIPTION: The patient was brought to the second floor [place] [facility]iac cath lab in the postabsorptive state. He was not premedicated . His right wrist was prepped and shaved in usual sterile fashion. Xylocaine 1% was used for local anesthesia. A 6 French sheath was inserted into the right radial artery using standard Seldinger technique. The patient received 12,000 units  of heparin intravenously.  A 5 [place] TIG catheter was used for selective coronary angiography and obtain left heart pressures.  Isovue dye was used for the entirety of the case (150 cc of contrast total to patient).  Retrograde aortic, left ventricular and pullback pressures were recorded.  Radial cocktail was administered via the SideArm sheath. The patient received 180 mg of p.o. Brilinta.  The ACT was 311 after his heparin bolus at the beginning of the case and at the end of the case was 275.  Isovue dye was used for the entirety of the intervention.  Retroaortic pressures monitored in the case.  Using a 6 [place] JR4 guide catheter along with 0.14 Prowater guidewire and a 2.5 mm x 12 m balloon the lesion in the proximal dominant RCA was crossed and predilated establishing antegrade flow.  The door to balloon time was 21 minutes.  Patient did become significantly bradycardic requiring intravenous atropine.  He also became hypotensive requiring IV fluid bolus and low-dose Levophed. I then carefully positioned a 2.75 x 22 mm long Medtronic Dakota drug-eluting stent across the disease segment and deployed at 14 to 16 atm.  Postdilated the entire stented segment with a 3 mm balloon.  There did appear to be a distal edge dissection.  I therefore placed a 3 mm x 12 mm long Medtronic Dakota balloon distally overlapping the previous stent and deployed at 16 atm.  There was mismatch between the very proximal portion of the RCA and the stented segment.  I therefore placed a 3.5  mm x 15 mm long Medtronic Dakota overlapping the proximal edge of the stent  and postdilated with a 4 mm x 10 mm noncompliant balloon up to 16 atm resulting in reduction of total occlusion to 0% residual with TIMI-3 flow.  His ST segments  did improve at the end of the case.  He was pain-free upon completion of the case.  The sheath was removed and a TR band was placed on the right wrist to achieve patent hemostasis.  The patient left lab in stable condition.   Successful PCI drug-eluting stenting of an occluded dominant proximal RCA using overlapping Medtronic Dakota drug-eluting stents.  The patient did have vomiting during and at the end of the case.  I was concerned that he may have vomited up his Brilinta and therefore bolused him with cangrelor and put him on a cangrelor drip.  We will reload him with p.o. Brilinta.  He will need uninterrupted DAPT for at least 12 months.  He in addition had a 70% distal left main which was short but no significant disease in the LAD and circumflex.  This will be treated medically at the current time.  2D echo will be ordered.  The patient was placed on high-dose atorvastatin, beta-blocker once his rhythm and heart rate have been documented to be stable. Quay Burow. MD, [facility] 04/04/2021 6:27 AM    DG Chest Port 1 View  Result Date: 04/04/2021 CLINICAL DATA:  65 year old male, STEMI. EXAM: PORTABLE CHEST 1 VIEW COMPARISON:  None. FINDINGS: Portable AP upright view at 0412 hours. Pacer or resuscitation pads project over the left lower chest. Normal lung volumes. Cardiac size at the upper limits of normal. Other mediastinal contours are within normal limits. Visualized tracheal air column is within normal limits. No pneumothorax, pulmonary edema, pleural effusion or consolidation. No acute osseous abnormality identified. IMPRESSION: No radiographic cardiopulmonary abnormality. Electronically Signed   By: Genevie Ann M.D.   On: 04/04/2021 04:37   ECHOCARDIOGRAM COMPLETE  Result Date: 04/04/2021    ECHOCARDIOGRAM REPORT   Patient Name:   Dakota Odom  [place] Date of Exam: 04/04/2021 Medical Rec #:  [id number]    Height:       66.0 in Accession #:    [id number]   Weight:       260.0 lb Date of Birth:  [birth year]    BSA:          2.236 m Patient Age:    11 years     BP:           121/78 mmHg Patient Gender: M            HR:           99 bpm. Exam Location:  Inpatient Procedure: 2D Echo, Cardiac Doppler and Color Doppler Indications:    NSTEMI  History:        Patient has no prior history of Echocardiogram examinations.                 Acute MI and CAD, Cardiac ath today; Arrythmias:PVC and LBBB.  Sonographer:    Merrie Roof RDCS Referring Phys: [place]  1. Left ventricular ejection fraction, by estimation, is 60 to 65%. The left ventricle has normal function. The left ventricle has no regional wall motion abnormalities. There is moderate concentric left ventricular hypertrophy. Left ventricular diastolic function could not be evaluated.  2. Right ventricular systolic function is normal. The right ventricular size is normal.  3. The mitral valve is grossly normal. No evidence of mitral valve regurgitation. No evidence of mitral stenosis.  4. The aortic valve is grossly normal. There is moderate calcification of the aortic valve. There is mild thickening of the aortic valve. Aortic valve regurgitation is trivial. Aortic  valve sclerosis/calcification is present, without any evidence of aortic stenosis.  5. The inferior vena cava is dilated in size with <50% respiratory variability, suggesting right atrial pressure of 15 mmHg. Comparison(s): No prior Echocardiogram. Conclusion(s)/Recommendation(s): Otherwise normal echocardiogram, with minor abnormalities described in the report. Overall normal LVEF. There may be slight hypokinesis of the mid inferior wall, incompletely visualized, but this is trivial. FINDINGS  Left Ventricle: Left ventricular ejection fraction, by estimation, is 60 to 65%. The left ventricle has normal function. The left  ventricle has no regional wall motion abnormalities. The left ventricular internal cavity size was normal in size. There is  moderate concentric left ventricular hypertrophy. Left ventricular diastolic function could not be evaluated. Right Ventricle: The right ventricular size is normal. Right vetricular wall thickness was not well visualized. Right ventricular systolic function is normal. Left Atrium: Left atrial size was normal in size. Right Atrium: Right atrial size was normal in size. Pericardium: There is no evidence of pericardial effusion. Mitral Valve: The mitral valve is grossly normal. No evidence of mitral valve regurgitation. No evidence of mitral valve stenosis. Tricuspid Valve: The tricuspid valve is grossly normal. Tricuspid valve regurgitation is trivial. No evidence of tricuspid stenosis. Aortic Valve: The aortic valve is grossly normal. There is moderate calcification of the aortic valve. There is mild thickening of the aortic valve. Aortic valve regurgitation is trivial. Aortic valve sclerosis/calcification is present, without any evidence of aortic stenosis. Aortic valve mean gradient measures 6.0 mmHg. Aortic valve peak gradient measures 11.6 mmHg. Aortic valve area, by VTI measures 2.03 cm. Pulmonic Valve: The pulmonic valve was not well visualized. Pulmonic valve regurgitation is not visualized. Aorta: The aortic root, ascending aorta, aortic arch and descending aorta are all structurally normal, with no evidence of dilitation or obstruction. Venous: The inferior vena cava is dilated in size with less than 50% respiratory variability, suggesting right atrial pressure of 15 mmHg. IAS/Shunts: The atrial septum is grossly normal.  LEFT VENTRICLE PLAX 2D LVIDd:         5.20 cm LVIDs:         3.40 cm LV PW:         1.30 cm LV IVS:        1.40 cm LVOT diam:     2.30 cm LV SV:         51 LV SV Index:   23 LVOT Area:     4.15 cm  RIGHT VENTRICLE RV Basal diam:  3.90 cm LEFT ATRIUM              Index        RIGHT ATRIUM           Index LA diam:        3.00 cm 1.34 cm/m   RA Area:     15.20 cm LA Vol (A2C):   52.2 ml 23.35 ml/m  RA Volume:   38.80 ml  17.35 ml/m LA Vol (A4C):   47.9 ml 21.43 ml/m LA Biplane Vol: 50.2 ml 22.45 ml/m  AORTIC VALVE AV Area (Vmax):    2.00 cm AV Area (Vmean):   2.02 cm AV Area (VTI):     2.03 cm AV Vmax:           170.00 cm/s AV Vmean:          112.000 cm/s AV VTI:            0.252 m AV Peak Grad:      11.6 mmHg  AV Mean Grad:      6.0 mmHg LVOT Vmax:         81.90 cm/s LVOT Vmean:        54.400 cm/s LVOT VTI:          0.123 m LVOT/AV VTI ratio: 0.49  AORTA Ao Root diam: 3.40 cm Ao Asc diam:  3.40 cm  SHUNTS Systemic VTI:  0.12 m Systemic Diam: 2.30 cm Buford Dresser MD Electronically signed by Buford Dresser MD Signature Date/Time: 04/04/2021/7:11:08 PM    Final     Cardiac Studies   2D echocardiogram (04/04/2021)  IMPRESSIONS     1. Left ventricular ejection fraction, by estimation, is 60 to 65%. The  left ventricle has normal function. The left ventricle has no regional  wall motion abnormalities. There is moderate concentric left ventricular  hypertrophy. Left ventricular  diastolic function could not be evaluated.   2. Right ventricular systolic function is normal. The right ventricular  size is normal.   3. The mitral valve is grossly normal. No evidence of mitral valve  regurgitation. No evidence of mitral stenosis.   4. The aortic valve is grossly normal. There is moderate calcification of  the aortic valve. There is mild thickening of the aortic valve. Aortic  valve regurgitation is trivial. Aortic valve sclerosis/calcification is  present, without any evidence of  aortic stenosis.   5. The inferior vena cava is dilated in size with <50% respiratory  variability, suggesting right atrial pressure of 15 mmHg.  Cardiac catheterization/PCI drug-eluting stent (04/04/2021)  PROCEDURE DESCRIPTION:    The patient was brought  to the second floor [facility] cath lab in the postabsorptive state. He was not premedicated . His right wrist was prepped and shaved in usual sterile fashion. Xylocaine 1% was used for local anesthesia. A 6 French sheath was inserted into the right radial artery using standard Seldinger technique. The patient received 12,000 units  of heparin intravenously.  A 5 [place] TIG catheter was used for selective coronary angiography and obtain left heart pressures.  Isovue dye was used for the entirety of the case (150 cc of contrast total to patient).  Retrograde aortic, left ventricular and pullback pressures were recorded.  Radial cocktail was administered via the SideArm sheath.   The patient received 180 mg of p.o. Brilinta.  The ACT was 311 after his heparin bolus at the beginning of the case and at the end of the case was 275.  Isovue dye was used for the entirety of the intervention.  Retroaortic pressures monitored in the case.  Using a 6 [place] JR4 guide catheter along with 0.14 [place]ater guidewire and a 2.5 mm x 12 m balloon the lesion in the proximal dominant RCA was crossed and predilated establishing antegrade flow.  The door to balloon time was 21 minutes.  Patient did become significantly bradycardic requiring intravenous atropine.  He also became hypotensive requiring IV fluid bolus and low-dose Levophed.  I then carefully positioned a 2.75 x 22 mm long Medtronic Dakota drug-eluting stent across the disease segment and deployed at 14 to 16 atm.  Postdilated the entire stented segment with a 3 mm balloon.  There did appear to be a distal edge dissection.  I therefore placed a 3 mm x 12 mm long Medtronic Dakota balloon distally overlapping the previous stent and deployed at 16 atm.  There was mismatch between the very proximal portion of the RCA and the stented segment.  I therefore placed a 3.5  mm x 15 mm long Medtronic Dakota overlapping the proximal edge of the stent and postdilated with a 4 mm x 10  mm noncompliant balloon up to 16 atm resulting in reduction of total occlusion to 0% residual with TIMI-3 flow.  His ST segments did improve at the end of the case.  He was pain-free upon completion of the case.  The sheath was removed and a TR band was placed on the right wrist to achieve patent hemostasis.  The patient left lab in stable condition.       IMPRESSION: Successful PCI drug-eluting stenting of an occluded dominant proximal RCA using overlapping Medtronic Dakota drug-eluting stents.  The patient did have vomiting during and at the end of the case.  I was concerned that he may have vomited up his Brilinta and therefore bolused him with cangrelor and put him on a cangrelor drip.  We will reload him with p.o. Brilinta.  He will need uninterrupted DAPT for at least 12 months.  He in addition had a 70% distal left main which was short but no significant disease in the LAD and circumflex.  This will be treated medically at the current time.  2D echo will be ordered.  The patient was placed on high-dose atorvastatin, beta-blocker once his rhythm and heart rate have been documented to be stable. Coronary Diagrams  Diagnostic Dominance: Right Intervention    Patient Profile     65 y.o. moderately overweight married Caucasian male who was awakened from sleep with substernal chest pain early yesterday morning.  His only risk factor is hypertension.  He had inferior ST segment elevation and was transported to [facility] where he underwent emergent coronary angiography and intervention.  Assessment & Plan    1: Inferior STEMI-postop day 1 inferior STEMI treated with PCI drug-eluting stenting of the proximal mid occluded dominant RCA.  He did have 70% distal left main which will be treated medically.  He has 3 drug-eluting stents.  He is on DAPT which he will remain on uninterrupted for 12 months.  His LV function is normal.  2: Essential hypertension-blood pressure under good control.  He  is on a beta-blocker  3: Hyperlipidemia-LDL 120.  High-dose statin therapy initiated  4: Prediabetes-hemoglobin A1c 6.3.  We will start an SGLT2 inhibitor.  Patient stable postop day 1 inferior STEMI.  Will transfer to telemetry, ambulate with cardiac rehab with anticipation of "fast-track discharge" tomorrow.  For questions or updates, please contact [place] Please consult [email or web address] for contact info under        Signed, Quay Burow, MD  04/05/2021, 8:19 AM

## 2021-04-05 NOTE — Progress Notes (Signed)
CARDIAC REHAB PHASE I   Just finished walking with nursing staff without difficulty or angina.  Education completed re: MI, stent placement, angina symptoms, NTG usage, when to call 911, when to call the doctor.  Discussed dual platelet medications, activity restrictions, exercise progression, heart healthy nutrition tips, and smoking cessation.  Patient's entire family (spouse, and sons) along with patient use tobacco products and we spent a considerable amount of time concentrating on quitting.   Referred to phase II cardiac rehab @ [facility]. [id number]  Cindra Eves RN, BSN 04/05/2021 10:55 AM

## 2021-04-05 NOTE — Discharge Instructions (Signed)

## 2021-04-06 DIAGNOSIS — I10 Essential (primary) hypertension: Secondary | ICD-10-CM

## 2021-04-06 DIAGNOSIS — Z720 Tobacco use: Secondary | ICD-10-CM

## 2021-04-06 DIAGNOSIS — R7303 Prediabetes: Secondary | ICD-10-CM

## 2021-04-06 DIAGNOSIS — E785 Hyperlipidemia, unspecified: Secondary | ICD-10-CM

## 2021-04-06 MED ORDER — ATORVASTATIN CALCIUM 80 MG PO TABS: 80.0000 mg | ORAL_TABLET | Freq: Every day | ORAL | 3 refills | Status: DC

## 2021-04-06 MED ORDER — METOPROLOL SUCCINATE ER 25 MG PO TB24: 25.0000 mg | ORAL_TABLET | Freq: Every day | ORAL | 11 refills | Status: DC

## 2021-04-06 MED ORDER — EMPAGLIFLOZIN 10 MG PO TABS: 10.0000 mg | ORAL_TABLET | Freq: Every day | ORAL | 12 refills | Status: DC

## 2021-04-06 MED ORDER — NITROGLYCERIN 0.4 MG SL SUBL: 0.4000 mg | SUBLINGUAL_TABLET | SUBLINGUAL | 3 refills | Status: DC | PRN

## 2021-04-06 MED ORDER — TICAGRELOR 90 MG PO TABS: 90.0000 mg | ORAL_TABLET | Freq: Two times a day (BID) | ORAL | 12 refills | Status: DC

## 2021-04-06 MED ORDER — ASPIRIN EC 81 MG PO TBEC: 81.0000 mg | DELAYED_RELEASE_TABLET | Freq: Every day | ORAL | Status: DC

## 2021-04-06 MED ORDER — NITROGLYCERIN 0.4 MG SL SUBL
0.4000 mg | SUBLINGUAL_TABLET | SUBLINGUAL | 3 refills | Status: DC | PRN
  Filled 2021-04-06: qty 25, 1d supply, fill #0

## 2021-04-06 NOTE — Progress Notes (Deleted)
Progress Note  Patient Name: Dakota Odom Date of Encounter: 04/06/2021  [facility] Cardiologist: Dr. Quay Burow  Subjective     Inpatient Medications    Scheduled Meds:  aspirin  81 mg Oral Daily   atorvastatin  80 mg Oral Daily   Chlorhexidine Gluconate Cloth  6 each Topical Daily   empagliflozin  10 mg Oral Daily   metoprolol succinate  25 mg Oral Daily   nicotine  14 mg Transdermal Daily   sodium chloride flush  3 mL Intravenous Q12H   ticagrelor  90 mg Oral BID   Continuous Infusions:  sodium chloride 20 mL/hr at 04/04/21 0416   sodium chloride     PRN Meds: sodium chloride, acetaminophen, ALPRAZolam, morphine injection, nitroGLYCERIN, ondansetron (ZOFRAN) IV, sodium chloride flush   Vital Signs    Vitals:   04/05/21 1551 04/05/21 2111 04/06/21 0626 04/06/21 0818  BP: 136/66 118/70 (!) 141/96 (!) 143/78  Pulse: 92 78 89 92  Resp:  18    Temp:  98 F (36.7 C) 97.7 F (36.5 C)   TempSrc:  Oral Oral   SpO2: 94% 94% 95%   Weight:      Height:        Intake/Output Summary (Last 24 hours) at 04/06/2021 0912 Last data filed at 04/06/2021 0650 Gross per 24 hour  Intake 1207.34 ml  Output 1750 ml  Net -542.66 ml    Last 3 Weights 04/04/2021  Weight (lbs) 260 lb  Weight (kg) 117.935 kg      Telemetry    Sinus rhythm, sinus bradycardia with occasional junctional beats and occasional PVCs- Personally Reviewed  ECG    Sinus rhythm 86 with evolving inferior ST segment elevation with biphasic ST waves.- Personally Reviewed  Physical Exam   GEN: No acute distress.   Neck: No JVD Cardiac: RRR, no murmurs, rubs, or gallops.  Respiratory: Clear to auscultation bilaterally. GI: Soft, nontender, non-distended  MS: No edema; No deformity. Neuro:  Nonfocal  Psych: Normal affect   Labs    High Sensitivity Troponin:   Recent Labs  Lab 04/04/21 0400 04/04/21 0654  TROPONINIHS 1,788* 16,238*      Chemistry Recent Labs  Lab 04/04/21 0400  04/04/21 0654 04/04/21 1529 04/05/21 0229  NA 138  --  138 138  K 3.3*  --  4.6 4.7  CL 103  --  107 105  CO2 25  --  24 28  GLUCOSE 159*  --  100* 118*  BUN 13  --  14 14  CREATININE 0.84  --  0.93 0.94  CALCIUM 9.1  --  8.6* 8.7*  MG  --  2.0 2.0 2.2  PROT 7.3  --   --   --   ALBUMIN 3.6  --   --   --   AST 53*  --   --   --   ALT 38  --   --   --   ALKPHOS 70  --   --   --   BILITOT 0.4  --   --   --   GFRNONAA >60  --  >60 >60  ANIONGAP 10  --  7 5     Lipids  Recent Labs  Lab 04/05/21 0229  CHOL 180  TRIG 85  HDL 40*  LDLCALC 123*  CHOLHDL 4.5     Hematology Recent Labs  Lab 04/04/21 0400 04/05/21 0229  WBC 11.3* 12.6*  RBC 5.45 4.97  HGB 14.4 13.2  HCT 45.5 41.7  MCV 83.5 83.9  MCH 26.4 26.6  MCHC 31.6 31.7  RDW 14.9 15.2  PLT 322 244    Thyroid  Recent Labs  Lab 04/04/21 0654  TSH 1.131     BNPNo results for input(s): BNP, PROBNP in the last 168 hours.  DDimer No results for input(s): DDIMER in the last 168 hours.   Radiology    ECHOCARDIOGRAM COMPLETE  Result Date: 04/04/2021    ECHOCARDIOGRAM REPORT   Patient Name:   Dakota Odom Date of Exam: 04/04/2021 Medical Rec #:  [id number]    Height:       66.0 in Accession #:    [id number]   Weight:       260.0 lb Date of Birth:  [birth year]    BSA:          2.236 m Patient Age:    65 years     BP:           121/78 mmHg Patient Gender: M            HR:           99 bpm. Exam Location:  Inpatient Procedure: 2D Echo, Cardiac Doppler and Color Doppler Indications:    NSTEMI  History:        Patient has no prior history of Echocardiogram examinations.                 Acute MI and CAD, Cardiac ath today; Arrythmias:PVC and LBBB.  Sonographer:    Merrie Roof RDCS Referring Phys: Jerome  1. Left ventricular ejection fraction, by estimation, is 60 to 65%. The left ventricle has normal function. The left ventricle has no regional wall motion abnormalities. There is moderate concentric  left ventricular hypertrophy. Left ventricular diastolic function could not be evaluated.  2. Right ventricular systolic function is normal. The right ventricular size is normal.  3. The mitral valve is grossly normal. No evidence of mitral valve regurgitation. No evidence of mitral stenosis.  4. The aortic valve is grossly normal. There is moderate calcification of the aortic valve. There is mild thickening of the aortic valve. Aortic valve regurgitation is trivial. Aortic valve sclerosis/calcification is present, without any evidence of aortic stenosis.  5. The inferior vena cava is dilated in size with <50% respiratory variability, suggesting right atrial pressure of 15 mmHg. Comparison(s): No prior Echocardiogram. Conclusion(s)/Recommendation(s): Otherwise normal echocardiogram, with minor abnormalities described in the report. Overall normal LVEF. There may be slight hypokinesis of the mid inferior wall, incompletely visualized, but this is trivial. FINDINGS  Left Ventricle: Left ventricular ejection fraction, by estimation, is 60 to 65%. The left ventricle has normal function. The left ventricle has no regional wall motion abnormalities. The left ventricular internal cavity size was normal in size. There is  moderate concentric left ventricular hypertrophy. Left ventricular diastolic function could not be evaluated. Right Ventricle: The right ventricular size is normal. Right vetricular wall thickness was not well visualized. Right ventricular systolic function is normal. Left Atrium: Left atrial size was normal in size. Right Atrium: Right atrial size was normal in size. Pericardium: There is no evidence of pericardial effusion. Mitral Valve: The mitral valve is grossly normal. No evidence of mitral valve regurgitation. No evidence of mitral valve stenosis. Tricuspid Valve: The tricuspid valve is grossly normal. Tricuspid valve regurgitation is trivial. No evidence of tricuspid stenosis. Aortic Valve: The  aortic valve is grossly normal. There is moderate calcification of  the aortic valve. There is mild thickening of the aortic valve. Aortic valve regurgitation is trivial. Aortic valve sclerosis/calcification is present, without any evidence of aortic stenosis. Aortic valve mean gradient measures 6.0 mmHg. Aortic valve peak gradient measures 11.6 mmHg. Aortic valve area, by VTI measures 2.03 cm. Pulmonic Valve: The pulmonic valve was not well visualized. Pulmonic valve regurgitation is not visualized. Aorta: The aortic root, ascending aorta, aortic arch and descending aorta are all structurally normal, with no evidence of dilitation or obstruction. Venous: The inferior vena cava is dilated in size with less than 50% respiratory variability, suggesting right atrial pressure of 15 mmHg. IAS/Shunts: The atrial septum is grossly normal.  LEFT VENTRICLE PLAX 2D LVIDd:         5.20 cm LVIDs:         3.40 cm LV PW:         1.30 cm LV IVS:        1.40 cm LVOT diam:     2.30 cm LV SV:         51 LV SV Index:   23 LVOT Area:     4.15 cm  RIGHT VENTRICLE RV Basal diam:  3.90 cm LEFT ATRIUM             Index        RIGHT ATRIUM           Index LA diam:        3.00 cm 1.34 cm/m   RA Area:     15.20 cm LA Vol (A2C):   52.2 ml 23.35 ml/m  RA Volume:   38.80 ml  17.35 ml/m LA Vol (A4C):   47.9 ml 21.43 ml/m LA Biplane Vol: 50.2 ml 22.45 ml/m  AORTIC VALVE AV Area (Vmax):    2.00 cm AV Area (Vmean):   2.02 cm AV Area (VTI):     2.03 cm AV Vmax:           170.00 cm/s AV Vmean:          112.000 cm/s AV VTI:            0.252 m AV Peak Grad:      11.6 mmHg AV Mean Grad:      6.0 mmHg LVOT Vmax:         81.90 cm/s LVOT Vmean:        54.400 cm/s LVOT VTI:          0.123 m LVOT/AV VTI ratio: 0.49  AORTA Ao Root diam: 3.40 cm Ao Asc diam:  3.40 cm  SHUNTS Systemic VTI:  0.12 m Systemic Diam: 2.30 cm Buford Dresser MD Electronically signed by Buford Dresser MD Signature Date/Time: 04/04/2021/7:11:08 PM    Final      Cardiac Studies   2D echocardiogram (04/04/2021)  IMPRESSIONS     1. Left ventricular ejection fraction, by estimation, is 60 to 65%. The  left ventricle has normal function. The left ventricle has no regional  wall motion abnormalities. There is moderate concentric left ventricular  hypertrophy. Left ventricular  diastolic function could not be evaluated.   2. Right ventricular systolic function is normal. The right ventricular  size is normal.   3. The mitral valve is grossly normal. No evidence of mitral valve  regurgitation. No evidence of mitral stenosis.   4. The aortic valve is grossly normal. There is moderate calcification of  the aortic valve. There is mild thickening of the aortic valve. Aortic  valve regurgitation  is trivial. Aortic valve sclerosis/calcification is  present, without any evidence of  aortic stenosis.   5. The inferior vena cava is dilated in size with <50% respiratory  variability, suggesting right atrial pressure of 15 mmHg.  Cardiac catheterization/PCI drug-eluting stent (04/04/2021)  PROCEDURE DESCRIPTION:    The patient was brought to the second floor [facility] cath lab in the postabsorptive state. He was not premedicated . His right wrist was prepped and shaved in usual sterile fashion. Xylocaine 1% was used for local anesthesia. A 6 French sheath was inserted into the right radial artery using standard Seldinger technique. The patient received 12,000 units  of heparin intravenously.  A 5 [place] TIG catheter was used for selective coronary angiography and obtain left heart pressures.  Isovue dye was used for the entirety of the case (150 cc of contrast total to patient).  Retrograde aortic, left ventricular and pullback pressures were recorded.  Radial cocktail was administered via the SideArm sheath.   The patient received 180 mg of p.o. Brilinta.  The ACT was 311 after his heparin bolus at the beginning of the case and at the end of the  case was 275.  Isovue dye was used for the entirety of the intervention.  Retroaortic pressures monitored in the case.  Using a 6 [place] JR4 guide catheter along with 0.14 [place] guidewire and a 2.5 mm x 12 m balloon the lesion in the proximal dominant RCA was crossed and predilated establishing antegrade flow.  The door to balloon time was 21 minutes.  Patient did become significantly bradycardic requiring intravenous atropine.  He also became hypotensive requiring IV fluid bolus and low-dose Levophed.  I then carefully positioned a 2.75 x 22 mm long Medtronic Onyx drug-eluting stent across the disease segment and deployed at 14 to 16 atm.  Postdilated the entire stented segment with a 3 mm balloon.  There did appear to be a distal edge dissection.  I therefore placed a 3 mm x 12 mm long Medtronic Onyx balloon distally overlapping the previous stent and deployed at 16 atm.  There was mismatch between the very proximal portion of the RCA and the stented segment.  I therefore placed a 3.5  mm x 15 mm long Medtronic Onyx overlapping the proximal edge of the stent and postdilated with a 4 mm x 10 mm noncompliant balloon up to 16 atm resulting in reduction of total occlusion to 0% residual with TIMI-3 flow.  His ST segments did improve at the end of the case.  He was pain-free upon completion of the case.  The sheath was removed and a TR band was placed on the right wrist to achieve patent hemostasis.  The patient left lab in stable condition.       IMPRESSION: Successful PCI drug-eluting stenting of an occluded dominant proximal RCA using overlapping Medtronic Onyx drug-eluting stents.  The patient did have vomiting during and at the end of the case.  I was concerned that he may have vomited up his Brilinta and therefore bolused him with cangrelor and put him on a cangrelor drip.  We Caitlen Worth reload him with p.o. Brilinta.  He Joenathan Sakuma need uninterrupted DAPT for at least 12 months.  He in addition had a 70% distal  left main which was short but no significant disease in the LAD and circumflex.  This Inger Wiest be treated medically at the current time.  2D echo Gailene Youkhana be ordered.  The patient was placed on high-dose atorvastatin, beta-blocker once his  rhythm and heart rate have been documented to be stable. Coronary Diagrams  Diagnostic Dominance: Right Intervention    Patient Profile     65 y.o. moderately overweight married Caucasian male who was awakened from sleep with substernal chest pain early yesterday morning.  His only risk factor is hypertension.  He had inferior ST segment elevation and was transported to [facility] where he underwent emergent coronary angiography and intervention.  Assessment & Plan    1: Inferior STEMI-postop day 1 inferior STEMI treated with PCI drug-eluting stenting of the proximal mid occluded dominant RCA.  He did have 70% distal left main which Cassady Stanczak be treated medically.  He has 3 drug-eluting stents.  He is on DAPT which he Sartaj Hoskin remain on uninterrupted for 12 months.  His LV function is normal.  2: Essential hypertension-blood pressure under good control.  He is on a beta-blocker  3: Hyperlipidemia-LDL 120.  High-dose statin therapy initiated  4: Prediabetes-hemoglobin A1c 6.3.  We Caidon Foti start an SGLT2 inhibitor.  Patient stable postop day 1 inferior STEMI.  Everline Mahaffy transfer to telemetry, ambulate with cardiac rehab with anticipation of "fast-track discharge" tomorrow.  For questions or updates, please contact [place] Please consult [email or web address] for contact info under        Signed, Genia Perin Meredith Leeds, MD  04/06/2021, 9:12 AM

## 2021-04-06 NOTE — Discharge Summary (Addendum)
Discharge Summary    Patient ID: CHIEF WALKUP MRN: [id number]; DOB: October 04, 1955  Admit date: 04/04/2021 Discharge date: 04/06/2021  PCP:  Pcp, No   [facility] Providers Cardiologist:  None        Discharge Diagnoses    Principal Problem:   STEMI involving right coronary artery [facility]) Active Problems:   Hyperlipidemia LDL goal <70   Prediabetes   Hypertension   Tobacco abuse    Diagnostic Studies/Procedures    Cath 04/04/2021   Ost LM to Mid LM lesion is 70% stenosed.   1st Mrg lesion is 50% stenosed.   1st Diag lesion is 50% stenosed.   Prox RCA lesion is 100% stenosed.   A drug-eluting stent was successfully placed using a STENT ONYX FRONTIER 2.75X22.   Post intervention, there is a 0% residual stenosis _____________ IMPRESSION: Successful PCI drug-eluting stenting of an occluded dominant proximal RCA using overlapping Medtronic Onyx drug-eluting stents.  The patient did have vomiting during and at the end of the case.  I was concerned that he may have vomited up his Brilinta and therefore bolused him with cangrelor and put him on a cangrelor drip.  We Nicholl Onstott reload him with p.o. Brilinta.  He Cipriana Biller need uninterrupted DAPT for at least 12 months.  He in addition had a 70% distal left main which was short but no significant disease in the LAD and circumflex.  This Mordche Hedglin be treated medically at the current time.  2D echo Gertude Benito be ordered.  The patient was placed on high-dose atorvastatin, beta-blocker once his rhythm and heart rate have been documented to be stable.   History of Present Illness     Dakota Odom is a 65 y.o. male with no significant prior cardiac hx who is being seen 04/04/2021 for the evaluation of CP and EKG changes suggestive of STEMI.  Mr. Dakota Odom has no prior cardiac hx but has h/o HTN, obesity and tobacco abuse and presented to the ED at [facility] c/o CP that has been going on now for about 2-3 hours. Onset was sudden, random and woke him from sleep, as a  discomfort or heaviness in the center of his chest that was severe and unrelenting. It was associated with SOB. Pt states he has not seen a doctor "in years" and takes no medications at home. EKG obtained by the [facility] shows >55m ST elevation in the inferior leads w/ reciprocal depression laterally. STEMI activated [facility]  Hospital Course     Consultants: N/A   Initial EKG demonstrated ST elevation in the inferior leads consistent with an inferior STEMI.  Patient was taken urgently to the Cath Lab by Dr. BGwenlyn Found  Cardiac catheterization performed on 04/04/2021 showed 70% ostial to mid left main lesion, 50% OM1 lesion, 50% D1 lesion, 100% proximal RCA lesion treated with Onyx frontier 2.75 x 22 mm DES.  Patient had vomiting during and at the end of the case.  Due to concern that he have vomited up his Brilinta, therefore he was placed on cangrelor drip afterward.  Brilinta was reloaded.  It was recommended he continue uninterrupted antiplatelet therapy for at least 12 months.  In addition he had 70% distal left main lesion which was short but no significant disease in the LAD and the left circumflex, this was treated medically.  Postprocedure, his serial troponin trended up to 16,238.  Echocardiogram performed on 04/04/2021 showed EF 60 to 65%, moderate LVH, trivial AI.  TSH was  normal.  Lipid panel showed HDL 40, LDL 123, total cholesterol 180, triglyceride 85.  He has been placed on 80 mg daily of Lipitor.  Hemoglobin A1c was 6.3 which placed the patient in the category of prediabetes.  He was started on Jardiance.  Patient was seen in the morning of 04/06/2021 by Dr. Curt Bears at which time he was doing well without significant chest discomfort.  Emphasis has been placed on compliance with dual antiplatelet therapy for 12 months.   Did the patient have an acute coronary syndrome (MI, NSTEMI, STEMI, etc) this admission?:  Yes                               AHA/ACC  Clinical Performance & Quality Measures: Aspirin prescribed? - Yes ADP Receptor Inhibitor (Plavix/Clopidogrel, Brilinta/Ticagrelor or Effient/Prasugrel) prescribed (includes medically managed patients)? - Yes Beta Blocker prescribed? - Yes High Intensity Statin (Lipitor 40-59m or Crestor 20-477m prescribed? - Yes EF assessed during THIS hospitalization? - Yes For EF <40%, was ACEI/ARB prescribed? - Not Applicable (EF >/= 4093%For EF <40%, Aldosterone Antagonist (Spironolactone or Eplerenone) prescribed? - Not Applicable (EF >/= 4073%Cardiac Rehab Phase II ordered (including medically managed patients)? - Yes       The patient Dakota Odom be scheduled for a TOC follow up appointment in 7-14 days.  A message has been [facility] to the [facility] at the office where the patient should be seen for follow up.  _____________  Discharge Vitals Blood pressure (!) 143/78, pulse 92, temperature 97.7 F (36.5 C), temperature source Oral, resp. rate 18, height 5' 6"  (1.676 m), weight 117.9 kg, SpO2 95 %.  Filed Weights   04/04/21 0402  Weight: 117.9 kg    Labs & Radiologic Studies    CBC Recent Labs    04/04/21 0400 04/05/21 0229  WBC 11.3* 12.6*  NEUTROABS 6.3  --   HGB 14.4 13.2  HCT 45.5 41.7  MCV 83.5 83.9  PLT 322 24428 Basic Metabolic Panel Recent Labs    04/04/21 1529 04/05/21 0229  NA 138 138  K 4.6 4.7  CL 107 105  CO2 24 28  GLUCOSE 100* 118*  BUN 14 14  CREATININE 0.93 0.94  CALCIUM 8.6* 8.7*  MG 2.0 2.2   Liver Function Tests Recent Labs    04/04/21 0400  AST 53*  ALT 38  ALKPHOS 70  BILITOT 0.4  PROT 7.3  ALBUMIN 3.6   No results for input(s): LIPASE, AMYLASE in the last 72 hours. High Sensitivity Troponin:   Recent Labs  Lab 04/04/21 0400 04/04/21 0654  TROPONINIHS 1,788* 16,238*    BNP Invalid input(s): POCBNP D-Dimer No results for input(s): DDIMER in the last 72 hours. Hemoglobin A1C Recent Labs    04/04/21 0654  HGBA1C 6.3*    Fasting Lipid Panel Recent Labs    04/05/21 0229  CHOL 180  HDL 40*  LDLCALC 123*  TRIG 85  CHOLHDL 4.5   Thyroid Function Tests Recent Labs    04/04/21 0654  TSH 1.131   _____________  CARDIAC CATHETERIZATION  Result Date: 04/04/2021 Images from the original result were not included.   Ost LM to Mid LM lesion is 70% stenosed.   1st Mrg lesion is 50% stenosed.   1st Diag lesion is 50% stenosed.   Prox RCA lesion is 100% stenosed.   A drug-eluting stent was successfully placed using a STYork  7.40C14.   Post intervention, there is a 0% residual stenosis. JAELON GATLEY is a 65 y.o. male  [id number] LOCATION:  FACILITY: [facility] PHYSICIAN: Quay Burow, M.D. 10/05/1955 DATE OF PROCEDURE:  04/04/2021 DATE OF DISCHARGE: CARDIAC CATHETERIZATION / PCI DES RCA History obtained from chart review.  Mr. Maiden is a 65 year old morbidly overweight married Caucasian male without prior cardiac history who was awakened in the early morning hours with chest pain.  He went to [facility] and was found to have inferior ST segment elevation and was transported urgently to [facility] for cath and intervention. PROCEDURE DESCRIPTION: The patient was brought to the second floor [place] [facility] cath lab in the postabsorptive state. He was not premedicated . His right wrist was prepped and shaved in usual sterile fashion. Xylocaine 1% was used for local anesthesia. A 6 French sheath was inserted into the right radial artery using standard Seldinger technique. The patient received 12,000 units  of heparin intravenously.  A 5 [place] TIG catheter was used for selective coronary angiography and obtain left heart pressures.  Isovue dye was used for the entirety of the case (150 cc of contrast total to patient).  Retrograde aortic, left ventricular and pullback pressures were recorded.  Radial cocktail was administered via the SideArm sheath. The patient received 180 mg of p.o. Brilinta.  The ACT  was 311 after his heparin bolus at the beginning of the case and at the end of the case was 275.  Isovue dye was used for the entirety of the intervention.  Retroaortic pressures monitored in the case.  Using a 6 [place] JR4 guide catheter along with 0.14 [place] guidewire and a 2.5 mm x 12 m balloon the lesion in the proximal dominant RCA was crossed and predilated establishing antegrade flow.  The door to balloon time was 21 minutes.  Patient did become significantly bradycardic requiring intravenous atropine.  He also became hypotensive requiring IV fluid bolus and low-dose Levophed. I then carefully positioned a 2.75 x 22 mm long Medtronic Onyx drug-eluting stent across the disease segment and deployed at 14 to 16 atm.  Postdilated the entire stented segment with a 3 mm balloon.  There did appear to be a distal edge dissection.  I therefore placed a 3 mm x 12 mm long Medtronic Onyx balloon distally overlapping the previous stent and deployed at 16 atm.  There was mismatch between the very proximal portion of the RCA and the stented segment.  I therefore placed a 3.5  mm x 15 mm long Medtronic Onyx overlapping the proximal edge of the stent and postdilated with a 4 mm x 10 mm noncompliant balloon up to 16 atm resulting in reduction of total occlusion to 0% residual with TIMI-3 flow.  His ST segments did improve at the end of the case.  He was pain-free upon completion of the case.  The sheath was removed and a TR band was placed on the right wrist to achieve patent hemostasis.  The patient left lab in stable condition.   Successful PCI drug-eluting stenting of an occluded dominant proximal RCA using overlapping Medtronic Onyx drug-eluting stents.  The patient did have vomiting during and at the end of the case.  I was concerned that he may have vomited up his Brilinta and therefore bolused him with cangrelor and put him on a cangrelor drip.  We Roux Brandy reload him with p.o. Brilinta.  He Alfonse Garringer need uninterrupted  DAPT for at least 12 months.  He  in addition had a 70% distal left main which was short but no significant disease in the LAD and circumflex.  This Reianna Batdorf be treated medically at the current time.  2D echo Avery Klingbeil be ordered.  The patient was placed on high-dose atorvastatin, beta-blocker once his rhythm and heart rate have been documented to be stable. Quay Burow. MD, [facility] 04/04/2021 6:27 AM    DG Chest Port 1 View  Result Date: 04/04/2021 CLINICAL DATA:  65 year old male, STEMI. EXAM: PORTABLE CHEST 1 VIEW COMPARISON:  None. FINDINGS: Portable AP upright view at 0412 hours. Pacer or resuscitation pads project over the left lower chest. Normal lung volumes. Cardiac size at the upper limits of normal. Other mediastinal contours are within normal limits. Visualized tracheal air column is within normal limits. No pneumothorax, pulmonary edema, pleural effusion or consolidation. No acute osseous abnormality identified. IMPRESSION: No radiographic cardiopulmonary abnormality. Electronically Signed   By: Genevie Ann M.D.   On: 04/04/2021 04:37   ECHOCARDIOGRAM COMPLETE  Result Date: 04/04/2021    ECHOCARDIOGRAM REPORT   Patient Name:   Dakota Odom Fowers Date of Exam: 04/04/2021 Medical Rec #:  [id number]    Height:       66.0 in Accession #:    [id number]   Weight:       260.0 lb Date of Birth:  February 12, 1956    BSA:          2.236 m Patient Age:    69 years     BP:           121/78 mmHg Patient Gender: M            HR:           99 bpm. Exam Location:  Inpatient Procedure: 2D Echo, Cardiac Doppler and Color Doppler Indications:    NSTEMI  History:        Patient has no prior history of Echocardiogram examinations.                 Acute MI and CAD, Cardiac ath today; Arrythmias:PVC and LBBB.  Sonographer:    Merrie Roof RDCS Referring Phys: [place]  1. Left ventricular ejection fraction, by estimation, is 60 to 65%. The left ventricle has normal function. The left ventricle has no regional wall  motion abnormalities. There is moderate concentric left ventricular hypertrophy. Left ventricular diastolic function could not be evaluated.  2. Right ventricular systolic function is normal. The right ventricular size is normal.  3. The mitral valve is grossly normal. No evidence of mitral valve regurgitation. No evidence of mitral stenosis.  4. The aortic valve is grossly normal. There is moderate calcification of the aortic valve. There is mild thickening of the aortic valve. Aortic valve regurgitation is trivial. Aortic valve sclerosis/calcification is present, without any evidence of aortic stenosis.  5. The inferior vena cava is dilated in size with <50% respiratory variability, suggesting right atrial pressure of 15 mmHg. Comparison(s): No prior Echocardiogram. Conclusion(s)/Recommendation(s): Otherwise normal echocardiogram, with minor abnormalities described in the report. Overall normal LVEF. There may be slight hypokinesis of the mid inferior wall, incompletely visualized, but this is trivial. FINDINGS  Left Ventricle: Left ventricular ejection fraction, by estimation, is 60 to 65%. The left ventricle has normal function. The left ventricle has no regional wall motion abnormalities. The left ventricular internal cavity size was normal in size. There is  moderate concentric left ventricular hypertrophy. Left ventricular diastolic function could not be evaluated. Right Ventricle:  The right ventricular size is normal. Right vetricular wall thickness was not well visualized. Right ventricular systolic function is normal. Left Atrium: Left atrial size was normal in size. Right Atrium: Right atrial size was normal in size. Pericardium: There is no evidence of pericardial effusion. Mitral Valve: The mitral valve is grossly normal. No evidence of mitral valve regurgitation. No evidence of mitral valve stenosis. Tricuspid Valve: The tricuspid valve is grossly normal. Tricuspid valve regurgitation is trivial. No  evidence of tricuspid stenosis. Aortic Valve: The aortic valve is grossly normal. There is moderate calcification of the aortic valve. There is mild thickening of the aortic valve. Aortic valve regurgitation is trivial. Aortic valve sclerosis/calcification is present, without any evidence of aortic stenosis. Aortic valve mean gradient measures 6.0 mmHg. Aortic valve peak gradient measures 11.6 mmHg. Aortic valve area, by VTI measures 2.03 cm. Pulmonic Valve: The pulmonic valve was not well visualized. Pulmonic valve regurgitation is not visualized. Aorta: The aortic root, ascending aorta, aortic arch and descending aorta are all structurally normal, with no evidence of dilitation or obstruction. Venous: The inferior vena cava is dilated in size with less than 50% respiratory variability, suggesting right atrial pressure of 15 mmHg. IAS/Shunts: The atrial septum is grossly normal.  LEFT VENTRICLE PLAX 2D LVIDd:         5.20 cm LVIDs:         3.40 cm LV PW:         1.30 cm LV IVS:        1.40 cm LVOT diam:     2.30 cm LV SV:         51 LV SV Index:   23 LVOT Area:     4.15 cm  RIGHT VENTRICLE RV Basal diam:  3.90 cm LEFT ATRIUM             Index        RIGHT ATRIUM           Index LA diam:        3.00 cm 1.34 cm/m   RA Area:     15.20 cm LA Vol (A2C):   52.2 ml 23.35 ml/m  RA Volume:   38.80 ml  17.35 ml/m LA Vol (A4C):   47.9 ml 21.43 ml/m LA Biplane Vol: 50.2 ml 22.45 ml/m  AORTIC VALVE AV Area (Vmax):    2.00 cm AV Area (Vmean):   2.02 cm AV Area (VTI):     2.03 cm AV Vmax:           170.00 cm/s AV Vmean:          112.000 cm/s AV VTI:            0.252 m AV Peak Grad:      11.6 mmHg AV Mean Grad:      6.0 mmHg LVOT Vmax:         81.90 cm/s LVOT Vmean:        54.400 cm/s LVOT VTI:          0.123 m LVOT/AV VTI ratio: 0.49  AORTA Ao Root diam: 3.40 cm Ao Asc diam:  3.40 cm  SHUNTS Systemic VTI:  0.12 m Systemic Diam: 2.30 cm Buford Dresser MD Electronically signed by Buford Dresser MD Signature  Date/Time: 04/04/2021/7:11:08 PM    Final    Disposition   Pt is being discharged home today in good condition.  Follow-up Plans & Appointments     Follow-up Murray,  Pearletha Forge, MD Follow up.   Specialties: Cardiology, Radiology Why: cardiac scheduler Der Gagliano contact you to arrange 1-2 week follow up, please give [place] a call if you do not hear from our scheduler in 3 business days. Contact information: [address] [place] [address] [phone]                Discharge Instructions     AMB Referral to Cardiac Rehabilitation - Phase II   Complete by: As directed    Diagnosis: STEMI   After initial evaluation and assessments completed: Virtual Based Care may be provided alone or in conjunction with Phase 2 Cardiac Rehab based on patient barriers.: Yes   Amb Referral to Cardiac Rehabilitation   Complete by: As directed    Diagnosis:  STEMI Coronary Stents     After initial evaluation and assessments completed: Virtual Based Care may be provided alone or in conjunction with Phase 2 Cardiac Rehab based on patient barriers.: Yes   Diet - low sodium heart healthy   Complete by: As directed    Discharge instructions   Complete by: As directed    No lifting over 5 lbs for 1 week. No sexual activity for 1 week. Keep procedure site clean & dry. If you notice increased pain, swelling, bleeding or pus, call/return!  You may shower, but no soaking baths/hot tubs/pools for 1 week.   Increase activity slowly   Complete by: As directed        Discharge Medications   Allergies as of 04/06/2021   No Known Allergies      Medication List     TAKE these medications    aspirin EC 81 MG tablet Take 1 tablet (81 mg total) by mouth daily. What changed: additional instructions   atorvastatin 80 MG tablet Commonly known as: LIPITOR Take 1 tablet (80 mg total) by mouth daily.   Brilinta 90 MG Tabs tablet Generic drug: ticagrelor Take 1 tablet  (90 mg total) by mouth 2 (two) times daily.   cetirizine 10 MG tablet Commonly known as: ZYRTEC Take 10 mg by mouth daily.   Jardiance 10 MG Tabs tablet Generic drug: empagliflozin Take 1 tablet (10 mg total) by mouth daily.   metoprolol succinate 25 MG 24 hr tablet Commonly known as: TOPROL-XL Take 1 tablet (25 mg total) by mouth daily.   multivitamin with minerals Tabs tablet Take 1 tablet by mouth daily.   nitroGLYCERIN 0.4 MG SL tablet Commonly known as: NITROSTAT Place 1 tablet (0.4 mg total) under the tongue every 5 (five) minutes x 3 doses as needed for chest pain.           Outstanding Labs/Studies   FLP and LFT in 3 month (Pricsilla Lindvall defer to follow up appt to order)  Duration of Discharge Encounter   Greater than 30 minutes including physician time.  Hilbert Corrigan, PA 04/06/2021, 10:20 AM  I have seen and examined this patient with Almyra Deforest.  Agree with above, note added to reflect my findings.  Regular rhythm, no murmurs, lungs clear, no JVD, no lower extremity edema.  He is currently feeling well.  Status post inferior STEMI with RCA stent.  We Jamekia Gannett plan for discharge today with follow-up in clinic.  Greater than 30 minutes of patient care time was spent.   Shervon Kerwin M. Raymel Cull MD 04/07/2021 11:01 AM

## 2021-04-06 NOTE — Progress Notes (Signed)
Progress Note  Patient Name: Dakota Odom Date of Encounter: 04/06/2021  [facility] Cardiologist: Dr. Quay Burow  Subjective   Postop day 2 from inferior STEMI.  Currently feeling well without symptoms.  Ready to go home.  Inpatient Medications    Scheduled Meds:  aspirin  81 mg Oral Daily   atorvastatin  80 mg Oral Daily   Chlorhexidine Gluconate Cloth  6 each Topical Daily   empagliflozin  10 mg Oral Daily   metoprolol succinate  25 mg Oral Daily   nicotine  14 mg Transdermal Daily   sodium chloride flush  3 mL Intravenous Q12H   ticagrelor  90 mg Oral BID   Continuous Infusions:  sodium chloride 20 mL/hr at 04/04/21 0416   sodium chloride     PRN Meds: sodium chloride, acetaminophen, ALPRAZolam, morphine injection, nitroGLYCERIN, ondansetron (ZOFRAN) IV, sodium chloride flush   Vital Signs    Vitals:   04/05/21 1551 04/05/21 2111 04/06/21 0626 04/06/21 0818  BP: 136/66 118/70 (!) 141/96 (!) 143/78  Pulse: 92 78 89 92  Resp:  18    Temp:  98 F (36.7 C) 97.7 F (36.5 C)   TempSrc:  Oral Oral   SpO2: 94% 94% 95%   Weight:      Height:        Intake/Output Summary (Last 24 hours) at 04/06/2021 0914 Last data filed at 04/06/2021 4827 Gross per 24 hour  Intake 1207.34 ml  Output 1750 ml  Net -542.66 ml    Last 3 Weights 04/04/2021  Weight (lbs) 260 lb  Weight (kg) 117.935 kg      Telemetry    Sinus rhythm-personally reviewed  ECG    Sinus rhythm-personally reviewed  Physical Exam   GEN: Well nourished, well developed, in no acute distress  HEENT: normal  Neck: no JVD, carotid bruits, or masses Cardiac: RRR; no murmurs, rubs, or gallops,no edema  Respiratory:  clear to auscultation bilaterally, normal work of breathing GI: soft, nontender, nondistended, + BS MS: no deformity or atrophy  Skin: warm and dry Neuro:  Strength and sensation are intact Psych: euthymic mood, full affect    Labs    High Sensitivity Troponin:   Recent  Labs  Lab 04/04/21 0400 04/04/21 0654  TROPONINIHS 1,788* 16,238*      Chemistry Recent Labs  Lab 04/04/21 0400 04/04/21 0654 04/04/21 1529 04/05/21 0229  NA 138  --  138 138  K 3.3*  --  4.6 4.7  CL 103  --  107 105  CO2 25  --  24 28  GLUCOSE 159*  --  100* 118*  BUN 13  --  14 14  CREATININE 0.84  --  0.93 0.94  CALCIUM 9.1  --  8.6* 8.7*  MG  --  2.0 2.0 2.2  PROT 7.3  --   --   --   ALBUMIN 3.6  --   --   --   AST 53*  --   --   --   ALT 38  --   --   --   ALKPHOS 70  --   --   --   BILITOT 0.4  --   --   --   GFRNONAA >60  --  >60 >60  ANIONGAP 10  --  7 5     Lipids  Recent Labs  Lab 04/05/21 0229  CHOL 180  TRIG 85  HDL 40*  LDLCALC 123*  CHOLHDL 4.5  Hematology Recent Labs  Lab 04/04/21 0400 04/05/21 0229  WBC 11.3* 12.6*  RBC 5.45 4.97  HGB 14.4 13.2  HCT 45.5 41.7  MCV 83.5 83.9  MCH 26.4 26.6  MCHC 31.6 31.7  RDW 14.9 15.2  PLT 322 244    Thyroid  Recent Labs  Lab 04/04/21 0654  TSH 1.131     BNPNo results for input(s): BNP, PROBNP in the last 168 hours.  DDimer No results for input(s): DDIMER in the last 168 hours.   Radiology    ECHOCARDIOGRAM COMPLETE  Result Date: 04/04/2021    ECHOCARDIOGRAM REPORT   Patient Name:   LYTLE MALBURG Bewley Date of Exam: 04/04/2021 Medical Rec #:  [id number]    Height:       66.0 in Accession #:    [id number]   Weight:       260.0 lb Date of Birth:  [birth year]    BSA:          2.236 m Patient Age:    65 years     BP:           121/78 mmHg Patient Gender: M            HR:           99 bpm. Exam Location:  Inpatient Procedure: 2D Echo, Cardiac Doppler and Color Doppler Indications:    NSTEMI  History:        Patient has no prior history of Echocardiogram examinations.                 Acute MI and CAD, Cardiac ath today; Arrythmias:PVC and LBBB.  Sonographer:    Merrie Roof RDCS Referring Phys: [place]  1. Left ventricular ejection fraction, by estimation, is 60 to 65%. The left  ventricle has normal function. The left ventricle has no regional wall motion abnormalities. There is moderate concentric left ventricular hypertrophy. Left ventricular diastolic function could not be evaluated.  2. Right ventricular systolic function is normal. The right ventricular size is normal.  3. The mitral valve is grossly normal. No evidence of mitral valve regurgitation. No evidence of mitral stenosis.  4. The aortic valve is grossly normal. There is moderate calcification of the aortic valve. There is mild thickening of the aortic valve. Aortic valve regurgitation is trivial. Aortic valve sclerosis/calcification is present, without any evidence of aortic stenosis.  5. The inferior vena cava is dilated in size with <50% respiratory variability, suggesting right atrial pressure of 15 mmHg. Comparison(s): No prior Echocardiogram. Conclusion(s)/Recommendation(s): Otherwise normal echocardiogram, with minor abnormalities described in the report. Overall normal LVEF. There may be slight hypokinesis of the mid inferior wall, incompletely visualized, but this is trivial. FINDINGS  Left Ventricle: Left ventricular ejection fraction, by estimation, is 60 to 65%. The left ventricle has normal function. The left ventricle has no regional wall motion abnormalities. The left ventricular internal cavity size was normal in size. There is  moderate concentric left ventricular hypertrophy. Left ventricular diastolic function could not be evaluated. Right Ventricle: The right ventricular size is normal. Right vetricular wall thickness was not well visualized. Right ventricular systolic function is normal. Left Atrium: Left atrial size was normal in size. Right Atrium: Right atrial size was normal in size. Pericardium: There is no evidence of pericardial effusion. Mitral Valve: The mitral valve is grossly normal. No evidence of mitral valve regurgitation. No evidence of mitral valve stenosis. Tricuspid Valve: The tricuspid  valve is grossly normal. Tricuspid  valve regurgitation is trivial. No evidence of tricuspid stenosis. Aortic Valve: The aortic valve is grossly normal. There is moderate calcification of the aortic valve. There is mild thickening of the aortic valve. Aortic valve regurgitation is trivial. Aortic valve sclerosis/calcification is present, without any evidence of aortic stenosis. Aortic valve mean gradient measures 6.0 mmHg. Aortic valve peak gradient measures 11.6 mmHg. Aortic valve area, by VTI measures 2.03 cm. Pulmonic Valve: The pulmonic valve was not well visualized. Pulmonic valve regurgitation is not visualized. Aorta: The aortic root, ascending aorta, aortic arch and descending aorta are all structurally normal, with no evidence of dilitation or obstruction. Venous: The inferior vena cava is dilated in size with less than 50% respiratory variability, suggesting right atrial pressure of 15 mmHg. IAS/Shunts: The atrial septum is grossly normal.  LEFT VENTRICLE PLAX 2D LVIDd:         5.20 cm LVIDs:         3.40 cm LV PW:         1.30 cm LV IVS:        1.40 cm LVOT diam:     2.30 cm LV SV:         51 LV SV Index:   23 LVOT Area:     4.15 cm  RIGHT VENTRICLE RV Basal diam:  3.90 cm LEFT ATRIUM             Index        RIGHT ATRIUM           Index LA diam:        3.00 cm 1.34 cm/m   RA Area:     15.20 cm LA Vol (A2C):   52.2 ml 23.35 ml/m  RA Volume:   38.80 ml  17.35 ml/m LA Vol (A4C):   47.9 ml 21.43 ml/m LA Biplane Vol: 50.2 ml 22.45 ml/m  AORTIC VALVE AV Area (Vmax):    2.00 cm AV Area (Vmean):   2.02 cm AV Area (VTI):     2.03 cm AV Vmax:           170.00 cm/s AV Vmean:          112.000 cm/s AV VTI:            0.252 m AV Peak Grad:      11.6 mmHg AV Mean Grad:      6.0 mmHg LVOT Vmax:         81.90 cm/s LVOT Vmean:        54.400 cm/s LVOT VTI:          0.123 m LVOT/AV VTI ratio: 0.49  AORTA Ao Root diam: 3.40 cm Ao Asc diam:  3.40 cm  SHUNTS Systemic VTI:  0.12 m Systemic Diam: 2.30 cm Buford Dresser MD Electronically signed by Buford Dresser MD Signature Date/Time: 04/04/2021/7:11:08 PM    Final     Cardiac Studies   2D echocardiogram (04/04/2021)  IMPRESSIONS     1. Left ventricular ejection fraction, by estimation, is 60 to 65%. The  left ventricle has normal function. The left ventricle has no regional  wall motion abnormalities. There is moderate concentric left ventricular  hypertrophy. Left ventricular  diastolic function could not be evaluated.   2. Right ventricular systolic function is normal. The right ventricular  size is normal.   3. The mitral valve is grossly normal. No evidence of mitral valve  regurgitation. No evidence of mitral stenosis.   4. The aortic valve is grossly  normal. There is moderate calcification of  the aortic valve. There is mild thickening of the aortic valve. Aortic  valve regurgitation is trivial. Aortic valve sclerosis/calcification is  present, without any evidence of  aortic stenosis.   5. The inferior vena cava is dilated in size with <50% respiratory  variability, suggesting right atrial pressure of 15 mmHg.  Cardiac catheterization/PCI drug-eluting stent (04/04/2021)  PROCEDURE DESCRIPTION:    The patient was brought to the second floor [place] [facility]iac cath lab in the postabsorptive state. He was not premedicated . His right wrist was prepped and shaved in usual sterile fashion. Xylocaine 1% was used for local anesthesia. A 6 French sheath was inserted into the right radial artery using standard Seldinger technique. The patient received 12,000 units  of heparin intravenously.  A 5 [place] TIG catheter was used for selective coronary angiography and obtain left heart pressures.  Isovue dye was used for the entirety of the case (150 cc of contrast total to patient).  Retrograde aortic, left ventricular and pullback pressures were recorded.  Radial cocktail was administered via the SideArm sheath.   The patient received  180 mg of p.o. Brilinta.  The ACT was 311 after his heparin bolus at the beginning of the case and at the end of the case was 275.  Isovue dye was used for the entirety of the intervention.  Retroaortic pressures monitored in the case.  Using a 6 [place] JR4 guide catheter along with 0.14 [place] guidewire and a 2.5 mm x 12 m balloon the lesion in the proximal dominant RCA was crossed and predilated establishing antegrade flow.  The door to balloon time was 21 minutes.  Patient did become significantly bradycardic requiring intravenous atropine.  He also became hypotensive requiring IV fluid bolus and low-dose Levophed.  I then carefully positioned a 2.75 x 22 mm long Medtronic Onyx drug-eluting stent across the disease segment and deployed at 14 to 16 atm.  Postdilated the entire stented segment with a 3 mm balloon.  There did appear to be a distal edge dissection.  I therefore placed a 3 mm x 12 mm long Medtronic Onyx balloon distally overlapping the previous stent and deployed at 16 atm.  There was mismatch between the very proximal portion of the RCA and the stented segment.  I therefore placed a 3.5  mm x 15 mm long Medtronic Onyx overlapping the proximal edge of the stent and postdilated with a 4 mm x 10 mm noncompliant balloon up to 16 atm resulting in reduction of total occlusion to 0% residual with TIMI-3 flow.  His ST segments did improve at the end of the case.  He was pain-free upon completion of the case.  The sheath was removed and a TR band was placed on the right wrist to achieve patent hemostasis.  The patient left lab in stable condition.       IMPRESSION: Successful PCI drug-eluting stenting of an occluded dominant proximal RCA using overlapping Medtronic Onyx drug-eluting stents.  The patient did have vomiting during and at the end of the case.  I was concerned that he may have vomited up his Brilinta and therefore bolused him with cangrelor and put him on a cangrelor drip.  We Airyanna Dipalma reload  him with p.o. Brilinta.  He Zeriyah Wain need uninterrupted DAPT for at least 12 months.  He in addition had a 70% distal left main which was short but no significant disease in the LAD and circumflex.  This Oluwaseyi Raffel be treated  medically at the current time.  2D echo Bridget Melvina be ordered.  The patient was placed on high-dose atorvastatin, beta-blocker once his rhythm and heart rate have been documented to be stable. Coronary Diagrams  Diagnostic Dominance: Right Intervention    Patient Profile     65 y.o. moderately overweight married Caucasian male who was awakened from sleep with substernal chest pain early yesterday morning.  His only risk factor is hypertension.  He had inferior ST segment elevation and was transported to [facility] where he underwent emergent coronary angiography and intervention.  Assessment & Plan    1.  Inferior STEMI: Postop day 2 treated with PCI to the RCA.  Has a 70% distal left main which Macrina Lehnert be treated medically.  On dual antiplatelet therapy uninterrupted for 12 months.  LV function is normal.  2.  Hypertension: We Helen Cuff continue beta-blocker.  Blood pressure has been overall well controlled this hospitalization.  3.  Hyperlipidemia: LDL of 120.  High-dose statin initiated.  4.  Prediabetes: Have started SGLT2 inhibitor.  5.  Tobacco abuse: Complete cessation encouraged   For questions or updates, please contact [place] Please consult [email or web address] for contact info under        Signed, Robi Dewolfe Meredith Leeds, MD  04/06/2021, 9:14 AM

## 2021-04-09 ENCOUNTER — Other Ambulatory Visit (HOSPITAL_COMMUNITY): Payer: Self-pay

## 2021-04-10 ENCOUNTER — Telehealth: Payer: Self-pay | Admitting: Cardiovascular Disease

## 2021-04-10 NOTE — Telephone Encounter (Signed)
°*  STAT* If patient is at the pharmacy, call can be transferred to refill team.   1. Which medications need to be refilled? (please list name of each medication and dose if known) Nicotine Patches and cetirizine (ZYRTEC) 10 MG tablet   2. Which pharmacy/location (including street and city if local pharmacy) is medication to be sent to? [facility] - [place], [place] - [address]  3. Do they need a 30 day or 90 day supply? 30   Patient states that both of these were given to him while in the hospital, he wants to know if a prescription can be sent to his pharmacy beings that it would be cheaper for him to get. Please advise.

## 2021-04-11 ENCOUNTER — Telehealth: Payer: Self-pay | Admitting: Cardiovascular Disease

## 2021-04-11 NOTE — Telephone Encounter (Signed)
Patient called and wants to know the status of his FMLA papers. Please call back

## 2021-04-16 DIAGNOSIS — Z0279 Encounter for issue of other medical certificate: Secondary | ICD-10-CM

## 2021-04-17 ENCOUNTER — Telehealth (HOSPITAL_COMMUNITY): Payer: Self-pay

## 2021-04-17 ENCOUNTER — Other Ambulatory Visit (HOSPITAL_COMMUNITY): Payer: Self-pay

## 2021-04-17 NOTE — Telephone Encounter (Signed)
Pharmacy Transitions of Care Follow-up Telephone Call  Date of discharge: 04/06/21  Discharge Diagnosis: STEMI  How have you been since you were released from the hospital? Patient doing well since discharge, no questions about meds at this time.  Medication changes made at discharge: START taking: atorvastatin (LIPITOR)  empagliflozin (JARDIANCE)  metoprolol succinate (TOPROL-XL)  nitroGLYCERIN (NITROSTAT)  ticagrelor (BRILINTA)   Medication changes verified by the patient? Yes    Medication Accessibility:  Home Pharmacy: [facility]  Was the patient provided with refills on discharged medications? Yes but meds have all been d/c'd in system due to reorder to home pharmacy.   Have all prescriptions been transferred from [facility] to home pharmacy? N/A   Is the patient able to afford medications? Has insurance Notable copays: Brilinta copay is $35/month    Medication Review:  TICAGRELOR (BRILINTA) Ticagrelor 90 mg BID initiated on 04/05/21.  - Educated patient on expected duration of therapy of aspirin with ticagrelor.  - Discussed importance of taking medication around the same time every day - Advised patient of medications to avoid (NSAIDs, aspirin maintenance doses>100 mg daily) - Educated that Tylenol (acetaminophen) will be the preferred analgesic to prevent risk of bleeding  - Emphasized importance of monitoring for signs and symptoms of bleeding (abnormal bruising, prolonged bleeding, nose bleeds, bleeding from gums, discolored urine, black tarry stools)  - Educated patient to notify doctor if shortness of breath or abnormal heartbeat occur - Advised patient to alert all providers of antiplatelet therapy prior to starting a new medication or having a procedure   Follow-up Appointments:  [facility]? Scheduled to see Dr. Molli Hazard on 05/08/21 @ 8:00am.   If their condition worsens, is the pt aware to call PCP or go to the Emergency Dept.?  Yes  Final Patient Assessment: Patient has f/u scheduled and refills at home pharmacy.

## 2021-04-18 ENCOUNTER — Encounter: Payer: Self-pay | Admitting: *Deleted

## 2021-04-18 DIAGNOSIS — Z006 Encounter for examination for normal comparison and control in clinical research program: Secondary | ICD-10-CM

## 2021-04-18 NOTE — Research (Signed)
V-Inception Research Study  Patient Contacted about potential participation in [employer] V-Inception.  Study was discussed with the patient and the opportunity to ask questions was given.  Patient was emailed or paper mailed a copy of the consent.  Patient will be contacted on 1/11 for Follow-up phone call about information on the study.    This patient is eligible to participate in V-Inception Study.  The study is comparing the initiation of Inclisiran on top of usual care in Patient's with a recent acute coronary syndrome within the past 5 weeks.  Eligibility Criteria: recent ACS within 5 weeks, Serum LCL-C greater than or equal to 70mg /dL or non-HDL-C greater than or equal to 100mg /dL, and taking statin therapy or statin intolerant (not on PCSK9 Inhibitors).  Its a randomized, controlled, multicenter, open-label trial comparing a hospital post-discharge care pathway involving aggressive LCL-C management that includes Inclisiran with usual care versus usual care alone in patients with a recent ACS.   Jasmine Pang, RN BSN [facility] Cardiovascular Research & Education Direct Line: [phone]

## 2021-04-22 NOTE — Telephone Encounter (Signed)
FMLA papers were brought to [facility] office and have been in Coletta Memos, NP box up front. Faxing to [facility] office.  Has appointment with Coletta Memos, NP on 05/08/21 and with Dr. Gwenlyn Found in April.

## 2021-04-24 ENCOUNTER — Ambulatory Visit: Payer: BC Managed Care – PPO | Admitting: Cardiovascular Disease

## 2021-04-24 ENCOUNTER — Encounter: Payer: Self-pay | Admitting: Cardiovascular Disease

## 2021-04-24 ENCOUNTER — Other Ambulatory Visit: Payer: Self-pay

## 2021-04-24 DIAGNOSIS — I10 Essential (primary) hypertension: Secondary | ICD-10-CM | POA: Diagnosis not present

## 2021-04-24 DIAGNOSIS — I2111 ST elevation (STEMI) myocardial infarction involving right coronary artery: Secondary | ICD-10-CM

## 2021-04-24 DIAGNOSIS — Z720 Tobacco use: Secondary | ICD-10-CM

## 2021-04-24 DIAGNOSIS — E785 Hyperlipidemia, unspecified: Secondary | ICD-10-CM | POA: Diagnosis not present

## 2021-04-24 MED ORDER — NICOTINE 14 MG/24HR TD PT24: 14.0000 mg | MEDICATED_PATCH | Freq: Every day | TRANSDERMAL | 1 refills | Status: DC

## 2021-04-24 NOTE — Patient Instructions (Signed)
Medication Instructions:  Your physician recommends that you continue on your current medications as directed. Please refer to the Current Medication list given to you today.  *If you need a refill on your cardiac medications before your next appointment, please call your pharmacy*   Lab Work: Your physician recommends that you return for lab work in: 3 months for FASTING lipid/liver profile.  If you have labs (blood work) drawn today and your tests are completely normal, you will receive your results only by: MyChart Message (if you have MyChart) OR A paper copy in the mail If you have any lab test that is abnormal or we need to change your treatment, we will call you to review the results.   Follow-Up: At [facility], you and your health needs are our priority.  As part of our continuing mission to provide you with exceptional heart care, we have created designated Provider Care Teams.  These Care Teams include your primary Cardiologist (physician) and Advanced Practice Providers (APPs -  Physician Assistants and Nurse Practitioners) who all work together to provide you with the care you need, when you need it.  We recommend signing up for the patient portal called "MyChart".  Sign up information is provided on this After Visit Summary.  MyChart is used to connect with patients for Virtual Visits (Telemedicine).  Patients are able to view lab/test results, encounter notes, upcoming appointments, etc.  Non-urgent messages can be sent to your provider as well.   To learn more about what you can do with MyChart, go to [email or web address].    Your next appointment:   3 month(s)  The format for your next appointment:   In Person  Provider:   Jonathan Berry, MD 

## 2021-04-24 NOTE — Assessment & Plan Note (Signed)
History of essential hypertension blood pressure measured today 138/70.  He is on metoprolol.

## 2021-04-24 NOTE — Assessment & Plan Note (Signed)
BMI 42 

## 2021-04-24 NOTE — Assessment & Plan Note (Signed)
History of inferior STEMI on 04/04/2021.  He is awakened from sleep with chest pain, he drove himself to [facility] emergency room where he was diagnosed with ST segment elevation in the inferior leads.  He is transferred to [facility] where I performed urgent cardiac catheterization via the right radial approach.  He had 50 to 70% distal left main and a moderate ostial first diagonal branch stenosis.  His infarct-related artery was the RCA which is totally occluded.  I placed 3 overlapping drug-eluting stents reestablishing antegrade flow.  His 2D echo the following day was normal and he was discharged home on the 24th of dual antiplatelet therapy.  He denies chest pain or shortness of breath.

## 2021-04-24 NOTE — Progress Notes (Signed)
04/24/2021 Dakota Odom   03/05/1956  CN:[id number]  Primary Physician Leslie Andrea, MD Primary Cardiologist: Lorretta Harp MD Garret Reddish, [place], [place]  HPI:  Dakota Odom is a 66 y.o. 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  His primary care provider is Dr. Lemmie Evens in [facility].  His cardiac risk factors are notable for over 100 pack years of tobacco abuse having chest stopped at the time of his myocardial infarction.  He has treated hypertension, hyperlipidemia and a strong family history for heart disease.  His father had CABG and his mother had myocardial infarction, his brother died as well.  He was awakened with chest pain on the evening of 04/04/2021 and drove himself to the [facility] ER where he was found to have inferior ST segment elevation.  He was transported urgently to [facility] where I performed urgent coronary angiography via the right radial approach.  He had moderate distal left main disease, diagonal branch ostial disease and an occluded proximal dominant RCA which I stented using 3 overlapping Medtronic frontier drug-eluting stents.  I postdilated the proximal portion with a 4 mm balloon.  His echo the following day revealed normal LV systolic function and he was discharged home on the 24th on dual antiplatelet therapy.  He is been asymptomatic since.  He did stop smoking at the time of his myocardial infarction.   Current Meds  Medication Sig   aspirin EC 81 MG tablet Take 1 tablet (81 mg total) by mouth daily.   atorvastatin (LIPITOR) 80 MG tablet Take 1 tablet (80 mg total) by mouth daily.   cetirizine (ZYRTEC) 10 MG tablet Take 10 mg by mouth daily.   empagliflozin (JARDIANCE) 10 MG TABS tablet Take 1 tablet (10 mg total) by mouth daily.   metoprolol succinate (TOPROL-XL) 25 MG 24 hr tablet Take 1 tablet (25 mg total) by mouth daily.   Multiple Vitamin  (MULTIVITAMIN WITH MINERALS) TABS tablet Take 1 tablet by mouth daily.   nicotine (QC NICOTINE TRANSDERMAL SYSTEM) 21 mg/24hr patch Place 21 mg onto the skin daily.   nitroGLYCERIN (NITROSTAT) 0.4 MG SL tablet Place 1 tablet (0.4 mg total) under the tongue every 5 (five) minutes x 3 doses as needed for chest pain.   ticagrelor (BRILINTA) 90 MG TABS tablet Take 1 tablet (90 mg total) by mouth 2 (two) times daily.     No Known Allergies  Social History   Socioeconomic History   Marital status: Married    Spouse name: Not on file   Number of children: Not on file   Years of education: Not on file   Highest education level: Not on file  Occupational History   Not on file  Tobacco Use   Smoking status: Every Day    Packs/day: 0.50    Years: 20.00    Pack years: 10.00    Types: Cigarettes   Smokeless tobacco: Never  Substance and Sexual Activity   Alcohol use: Not on file   Drug use: Never   Sexual activity: Not on file  Other Topics Concern   Not on file  Social History Narrative   Not on file   Social Determinants of Health   Financial Resource Strain: Not on file  Food Insecurity: Not on file  Transportation Needs: Not on file  Physical Activity: Not on file  Stress: Not on file  Social Connections: Not on  file  Intimate Partner Violence: Not on file     Review of Systems: General: negative for chills, fever, night sweats or weight changes.  Cardiovascular: negative for chest pain, dyspnea on exertion, edema, orthopnea, palpitations, paroxysmal nocturnal dyspnea or shortness of breath Dermatological: negative for rash Respiratory: negative for cough or wheezing Urologic: negative for hematuria Abdominal: negative for nausea, vomiting, diarrhea, bright red blood per rectum, melena, or hematemesis Neurologic: negative for visual changes, syncope, or dizziness All other systems reviewed and are otherwise negative except as noted above.    Blood pressure 138/70,  pulse 86, resp. rate 20, height 5\' 6"  (1.676 m), weight 264 lb 12.8 oz (120.1 kg), SpO2 94 %.  General appearance: alert and no distress Neck: no adenopathy, no carotid bruit, no JVD, supple, symmetrical, trachea midline, and thyroid not enlarged, symmetric, no tenderness/mass/nodules Lungs: clear to auscultation bilaterally Heart: regular rate and rhythm, S1, S2 normal, no murmur, click, rub or gallop Extremities: extremities normal, atraumatic, no cyanosis or edema Pulses: 2+ and symmetric Skin: Skin color, texture, turgor normal. No rashes or lesions Neurologic: Grossly normal  EKG sinus rhythm at 86 with small inferior Q waves and occasional PVCs.  I personally reviewed this EKG.  ASSESSMENT AND PLAN:   STEMI involving right coronary artery ([place]) History of inferior STEMI on 04/04/2021.  He is awakened from sleep with chest pain, he drove himself to [facility] emergency room where he was diagnosed with ST segment elevation in the inferior leads.  He is transferred to [facility] where I performed urgent cardiac catheterization via the right radial approach.  He had 50 to 70% distal left main and a moderate ostial first diagonal branch stenosis.  His infarct-related artery was the RCA which is totally occluded.  I placed 3 overlapping drug-eluting stents reestablishing antegrade flow.  His 2D echo the following day was normal and he was discharged home on the 24th of dual antiplatelet therapy.  He denies chest pain or shortness of breath.  Hyperlipidemia LDL goal <70 History of hyperlipidemia previously not on statin therapy but currently on high-dose atorvastatin with lipid profile performed 04/05/2021 revealing total cholesterol 180, LDL 123 and HDL 40.  We will recheck a fasting lipid and liver profile in 3 months.  Hypertension History of essential hypertension blood pressure measured today 138/70.  He is on metoprolol.  Morbid obesity (HCC) BMI 42  Tobacco  abuse Over 100 pack years of tobacco abuse smoking 2-1/2 packs/day, discontinued at the time of his inferior STEMI.  He is currently wearing a NicoDerm patch.     Lorretta Harp MD FACP,FACC,FAHA, [facility] 04/24/2021 2:10 PM

## 2021-04-24 NOTE — Assessment & Plan Note (Signed)
History of hyperlipidemia previously not on statin therapy but currently on high-dose atorvastatin with lipid profile performed 04/05/2021 revealing total cholesterol 180, LDL 123 and HDL 40.  We will recheck a fasting lipid and liver profile in 3 months.

## 2021-04-24 NOTE — Assessment & Plan Note (Signed)
Over 100 pack years of tobacco abuse smoking 2-1/2 packs/day, discontinued at the time of his inferior STEMI.  He is currently wearing a NicoDerm patch.

## 2021-04-25 ENCOUNTER — Encounter: Payer: Self-pay | Admitting: *Deleted

## 2021-04-25 DIAGNOSIS — Z006 Encounter for examination for normal comparison and control in clinical research program: Secondary | ICD-10-CM

## 2021-04-25 NOTE — Research (Signed)
[facility] Follow-up Call, patient declined enrollment into the study at this time   Evern Bio, RN BSN  [facility] Education Direct Line: [phone]

## 2021-04-26 ENCOUNTER — Telehealth: Payer: Self-pay

## 2021-04-26 NOTE — Telephone Encounter (Signed)
Spoke with pt regarding FMLA paperwork. Specified where pt would like paperwork sent to as no information was noted on paperwork. Per pt to fax to [employer], attention to [employer]. Paperwork faxed today.

## 2021-04-26 NOTE — Telephone Encounter (Signed)
FMLA paperwork completed and faxed with confirmation of successful fax.

## 2021-05-08 ENCOUNTER — Ambulatory Visit (HOSPITAL_BASED_OUTPATIENT_CLINIC_OR_DEPARTMENT_OTHER): Payer: BC Managed Care – PPO | Admitting: General Practice

## 2021-05-31 DIAGNOSIS — J069 Acute upper respiratory infection, unspecified: Secondary | ICD-10-CM | POA: Diagnosis not present

## 2021-05-31 DIAGNOSIS — J019 Acute sinusitis, unspecified: Secondary | ICD-10-CM | POA: Diagnosis not present

## 2021-05-31 DIAGNOSIS — B349 Viral infection, unspecified: Secondary | ICD-10-CM | POA: Diagnosis not present

## 2021-07-31 ENCOUNTER — Emergency Department (HOSPITAL_COMMUNITY)
Admission: EM | Admit: 2021-07-31 | Discharge: 2021-07-31 | Disposition: A | Discharge status: Home / Self Care | Payer: BC Managed Care – PPO | Attending: Emergency Medicine | Admitting: Emergency Medicine

## 2021-07-31 ENCOUNTER — Encounter (HOSPITAL_COMMUNITY): Payer: Self-pay

## 2021-07-31 ENCOUNTER — Other Ambulatory Visit: Payer: Self-pay

## 2021-07-31 DIAGNOSIS — K644 Residual hemorrhoidal skin tags: Secondary | ICD-10-CM | POA: Diagnosis not present

## 2021-07-31 DIAGNOSIS — I10 Essential (primary) hypertension: Secondary | ICD-10-CM | POA: Diagnosis not present

## 2021-07-31 DIAGNOSIS — K625 Hemorrhage of anus and rectum: Secondary | ICD-10-CM

## 2021-07-31 DIAGNOSIS — Z7982 Long term (current) use of aspirin: Secondary | ICD-10-CM | POA: Insufficient documentation

## 2021-07-31 DIAGNOSIS — Z79899 Other long term (current) drug therapy: Secondary | ICD-10-CM | POA: Diagnosis not present

## 2021-07-31 HISTORY — DX: Acute myocardial infarction, unspecified: I21.9

## 2021-07-31 MED ORDER — HYDROCORTISONE ACETATE 25 MG RE SUPP: 25.0000 mg | Freq: Two times a day (BID) | RECTAL | 0 refills | Status: DC

## 2021-07-31 NOTE — ED Provider Notes (Signed)
?[place] EMERGENCY DEPARTMENT ?Provider Note ? ? ?CSN: [id number] ?Arrival date & time: 07/31/21  8003 ? ?  ? ?History ? ?Chief Complaint  ?Patient presents with  ? Rectal Bleeding  ? ? ?Dakota Odom is a 66 y.o. male. ? ? ?Rectal Bleeding ?Associated symptoms: no abdominal pain, no dizziness, no fever and no vomiting   ? ?66 year old male presents to ED with 1 hour history of rectal bleeding.  He said he noticed when he was having a bowel movement on the toilet at work.  Patient reports no burning or pain or itching when he was having a bowel movement or just prior.  But was noted when wiping and not in the bowl before.  He said it took about 3 wipes for bleeding stopped.  He has a history of hemorrhoids 12 years ago that were treated outpatient with symptomatic care.  Tenderness has noticed no masses or lumps or areas of tenderness when wiping.  He had a myocardial infarction in December of this past year and is currently taking aspirin and ticagrelor.  Denies constipation, bearing down with more force while having a bowel movement.  Patient had a myocardial infarction in December of this past year.  He is currently taking aspirin and Brilinta for said condition. ? ?Past medical history significant for myocardial infarction and hypertension.  He smoked cigarettes up until 04/05/2021 ? ?Home Medications ?Prior to Admission medications   ?Medication Sig Start Date End Date Taking? Authorizing Provider  ?aspirin EC 81 MG tablet Take 1 tablet (81 mg total) by mouth daily. 04/06/21  Yes Azalee Course, PA  ?atorvastatin (LIPITOR) 80 MG tablet Take 1 tablet (80 mg total) by mouth daily. 04/06/21  Yes Azalee Course, PA  ?cetirizine (ZYRTEC) 10 MG tablet Take 10 mg by mouth daily.   Yes [provider]  ?empagliflozin (JARDIANCE) 10 MG TABS tablet Take 1 tablet (10 mg total) by mouth daily. 04/06/21  Yes Azalee Course, PA  ?hydrocortisone (ANUSOL-HC) 25 MG suppository Place 1 suppository (25 mg total) rectally 2 (two) times  daily. 07/31/21  Yes Sherian Maroon A, PA  ?metoprolol succinate (TOPROL-XL) 25 MG 24 hr tablet Take 1 tablet (25 mg total) by mouth daily. 04/06/21  Yes Azalee Course, PA  ?Multiple Vitamin (MULTIVITAMIN WITH MINERALS) TABS tablet Take 1 tablet by mouth daily.   Yes [provider]  ?nitroGLYCERIN (NITROSTAT) 0.4 MG SL tablet Place 1 tablet (0.4 mg total) under the tongue every 5 (five) minutes x 3 doses as needed for chest pain. 04/06/21  Yes Azalee Course, PA  ?ticagrelor (BRILINTA) 90 MG TABS tablet Take 1 tablet (90 mg total) by mouth 2 (two) times daily. 04/06/21  Yes Azalee Course, PA  ?nicotine (NICODERM CQ) 14 mg/24hr patch Place 1 patch (14 mg total) onto the skin daily. ?Patient not taking: Reported on 07/31/2021 04/24/21   Runell Gess, MD  ?   ? ?Allergies    ?Patient has no known allergies.   ? ?Review of Systems   ?Review of Systems  ?Constitutional:  Negative for chills, fatigue and fever.  ?HENT:  Negative for congestion, postnasal drip, rhinorrhea and sore throat.   ?Respiratory:  Negative for cough, shortness of breath and wheezing.   ?Cardiovascular:  Negative for chest pain, palpitations and leg swelling.  ?Gastrointestinal:  Positive for anal bleeding and hematochezia. Negative for abdominal pain, diarrhea, nausea, rectal pain and vomiting.  ?     Blood after bowel movement while wiping  ?Genitourinary:  Negative  for difficulty urinating, dysuria, hematuria and urgency.  ?Skin:  Negative for color change, pallor, rash and wound.  ?Neurological:  Negative for dizziness, seizures, weakness and numbness.  ? ?Physical Exam ?Updated Vital Signs ?BP 112/66   Pulse 96   Temp 98 ?F (36.7 ?C)   Resp 19   Ht 5\' 6"  (1.676 m)   Wt 117.9 kg   SpO2 96%   BMI 41.97 kg/m?  ?Physical Exam ?Vitals and nursing note reviewed.  ?Constitutional:   ?   General: He is not in acute distress. ?   Appearance: Normal appearance. He is obese. He is not ill-appearing, toxic-appearing or diaphoretic.  ?HENT:  ?    Head: Normocephalic.  ?   Nose: Nose normal.  ?   Mouth/Throat:  ?   Mouth: Mucous membranes are moist.  ?   Pharynx: Oropharynx is clear.  ?Cardiovascular:  ?   Rate and Rhythm: Normal rate and regular rhythm.  ?   Heart sounds: Murmur heard.  ?   Comments: Systolic murmur noted right upper sternal border ?Pulmonary:  ?   Effort: Pulmonary effort is normal.  ?   Breath sounds: Normal breath sounds. No wheezing or rhonchi.  ?Chest:  ?   Chest wall: No tenderness.  ?Abdominal:  ?   General: Abdomen is flat. Bowel sounds are normal. There is no distension.  ?   Palpations: Abdomen is soft.  ?   Tenderness: There is no abdominal tenderness. There is no guarding or rebound.  ?Genitourinary: ?   Rectum: Guaiac result positive. Tenderness and external hemorrhoid present. No mass, anal fissure or internal hemorrhoid. Normal anal tone.  ?   Comments: Patient consented to digital rectal exam after exam was explained to patient.  He elected not to have a chaperone but wife observed in the room during the procedure.  Visible blood noticed at opening of rectum.  No active bleeding noticed.  3-4 external hemorrhoids noted upon inspection.  Insertion of digit led to exquisite tenderness.  No observable mass/lesion palpated on the inside indicative of a anal fissure or internal hemorrhoid.  External hemorrhoids are nonthrombosed and are most concerning for active source of bleeding.  Prostate not observed to be enlarged or tender; no nodules noted. ?Musculoskeletal:  ?   Right lower leg: No edema.  ?   Left lower leg: No edema.  ?Skin: ?   General: Skin is warm and dry.  ?   Capillary Refill: Capillary refill takes less than 2 seconds.  ?Neurological:  ?   General: No focal deficit present.  ?   Mental Status: He is alert and oriented to person, place, and time.  ?Psychiatric:     ?   Mood and Affect: Mood normal.     ?   Behavior: Behavior normal. Behavior is cooperative.  ? ? ?ED Results / Procedures / Treatments   ?Labs ?(all  labs ordered are listed, but only abnormal results are displayed) ?Labs Reviewed - No data to display ? ?EKG ?None ? ?Radiology ?No results found. ? ?Procedures ?Procedures  ? ? ?Medications Ordered in ED ?Medications - No data to display ? ?ED Course/ Medical Decision Making/ A&P ?  ?                        ?Medical Decision Making ? ?This patient presents to the ED for concern of rectal bleeding, this involves an extensive number of treatment options, and is a complaint that carries  with it a high risk of complications and morbidity.  The differential diagnosis includes external hemorrhoid, internal hemorrhoid, fissure, diverticulosis, ulcerative colitis, colon cancer, trauma to rectum.  ? ? ?Co morbidities that complicate the patient evaluation ? ?Obesity, history of external hemorrhoids ? ? ?Additional history obtained: ? ?Additional history obtained from MR ?External records from outside source obtained and reviewed including medical management for cardiac conditions after myocardial infarction ? ?Lab Tests: ? ?I Ordered, and personally interpreted labs.  The pertinent results include: Hemoccult is positive ? ? ?Imaging Studies ordered: ? ?No imaging ordered ? ?Cardiac Monitoring: / EKG: ? ?The patient was maintained on a cardiac monitor.  I personally viewed and interpreted the cardiac monitored which showed an underlying rhythm of: Normal sinus rhythm ? ? ?Consultations Obtained: ? ? ?Consultations obtained ? ?Problem List / ED Course / Critical interventions / Medication management ? ?Rectal bleeding ?I ordered medication including  ?Hydrocortisone suppository for external hemorrhoids ?Reevaluation of the patient after these medicines showed that the patient stayed the same --> medications to be picked up ?I have reviewed the patients home medicines and have made adjustments as needed ? ? ?Social Determinants of Health: ? ?Obesity ?Cigarette smoking up until 04/05/2021 ? ? ?Test / Admission -  Considered: ? ?Rectal bleeding ?Vital signs within normal range and stable throughout stay ?Clinical presentation of rectal bleeding that has since ceased.  Given the one-time episode of the rectal bleeding and minimal amount

## 2021-07-31 NOTE — Discharge Instructions (Addendum)
Continue symptomatic care with keeping bowel movements soft with MiraLAX.  Find some over-the-counter MiraLAX/polyethylene glycol to take.  Take suppository as directed.  Schedule routine colonoscopy for your 10-year mark.  Return to emergency department if you cannot get the bleeding to stop or if bleeding persists. ?

## 2021-07-31 NOTE — ED Triage Notes (Signed)
"  Went to have a bowel movement and noticed large amount of bright red blood in my stool this morning. No pain, no n/v/d, just one episode of blood in my stool enough to scare me" per pt ?

## 2021-08-02 ENCOUNTER — Encounter: Payer: Self-pay | Admitting: Cardiovascular Disease

## 2021-08-02 ENCOUNTER — Ambulatory Visit: Payer: BC Managed Care – PPO | Admitting: Cardiovascular Disease

## 2021-08-02 DIAGNOSIS — E785 Hyperlipidemia, unspecified: Secondary | ICD-10-CM | POA: Diagnosis not present

## 2021-08-02 DIAGNOSIS — Z720 Tobacco use: Secondary | ICD-10-CM | POA: Diagnosis not present

## 2021-08-02 DIAGNOSIS — I10 Essential (primary) hypertension: Secondary | ICD-10-CM | POA: Diagnosis not present

## 2021-08-02 DIAGNOSIS — I2111 ST elevation (STEMI) myocardial infarction involving right coronary artery: Secondary | ICD-10-CM | POA: Diagnosis not present

## 2021-08-02 LAB — HEPATIC FUNCTION PANEL
ALT: 21 IU/L (ref 0–44)
AST: 25 IU/L (ref 0–40)
Albumin: 3.9 g/dL (ref 3.8–4.8)
Alkaline Phosphatase: 94 IU/L (ref 44–121)
Bilirubin Total: 0.3 mg/dL (ref 0.0–1.2)
Bilirubin, Direct: 0.11 mg/dL (ref 0.00–0.40)
Total Protein: 6.9 g/dL (ref 6.0–8.5)

## 2021-08-02 LAB — LIPID PANEL
Chol/HDL Ratio: 2.9 ratio (ref 0.0–5.0)
Cholesterol, Total: 128 mg/dL (ref 100–199)
HDL: 44 mg/dL (ref >39)
LDL Chol Calc (NIH): 66 mg/dL (ref 0–99)
Triglycerides: 93 mg/dL (ref 0–149)
VLDL Cholesterol Cal: 18 mg/dL (ref 5–40)

## 2021-08-02 NOTE — Assessment & Plan Note (Signed)
History of essential hypertension a blood pressure measured today at 132/66.  He is on metoprolol. ?

## 2021-08-02 NOTE — Progress Notes (Signed)
? ? ? ?08/02/2021 ?Dakota Odom   ?Aug 23, 1955  ?[id number] ? ?Primary Physician John Giovanni, MD ?Primary Cardiologist: Runell Gess MD Nicholes Calamity, [place] ? ?HPI:  Dakota Odom is a 66 y.o.  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  His primary care provider is Dr. Gareth Morgan in [facility].  I last saw him in the office 04/24/2021.  His cardiac risk factors are notable for over 100 pack years of tobacco abuse having chest stopped at the time of his myocardial infarction.  He has treated hypertension, hyperlipidemia and a strong family history for heart disease.  His father had CABG and his mother had myocardial infarction, his brother died as well.  He was awakened with chest pain on the evening of 04/04/2021 and drove himself to the [facility] ER where he was found to have inferior ST segment elevation.  He was transported urgently to [facility] where I performed urgent coronary angiography via the right radial approach.  He had moderate distal left main disease, diagonal branch ostial disease and an occluded proximal dominant RCA which I stented using 3 overlapping Medtronic frontier drug-eluting stents.  I postdilated the proximal portion with a 4 mm balloon.  His echo the following day revealed normal LV systolic function and he was discharged home on the 24th on dual antiplatelet therapy.  He is been asymptomatic since.  He did stop smoking at the time of his myocardial infarction. ? ?Since I saw him 3 months ago he continues to do well.  He was wearing a NicoDerm patch which he took off several weeks ago and still has not smoked.  He is trying to lose weight.  He remains on DAPT.  He denies chest pain or shortness of breath. ? ? ?Current Meds  ?Medication Sig  ? aspirin EC 81 MG tablet Take 1 tablet (81 mg total) by mouth daily.  ? atorvastatin (LIPITOR) 80 MG tablet Take 1 tablet (80 mg total) by  mouth daily.  ? cetirizine (ZYRTEC) 10 MG tablet Take 10 mg by mouth daily.  ? empagliflozin (JARDIANCE) 10 MG TABS tablet Take 1 tablet (10 mg total) by mouth daily.  ? hydrocortisone (ANUSOL-HC) 25 MG suppository Place 1 suppository (25 mg total) rectally 2 (two) times daily.  ? metoprolol succinate (TOPROL-XL) 25 MG 24 hr tablet Take 1 tablet (25 mg total) by mouth daily.  ? Multiple Vitamin (MULTIVITAMIN WITH MINERALS) TABS tablet Take 1 tablet by mouth daily.  ? nitroGLYCERIN (NITROSTAT) 0.4 MG SL tablet Place 1 tablet (0.4 mg total) under the tongue every 5 (five) minutes x 3 doses as needed for chest pain.  ? ticagrelor (BRILINTA) 90 MG TABS tablet Take 1 tablet (90 mg total) by mouth 2 (two) times daily.  ?  ? ?No Known Allergies ? ?Social History  ? ?Socioeconomic History  ? Marital status: Married  ?  Spouse name: Not on file  ? Number of children: Not on file  ? Years of education: Not on file  ? Highest education level: Not on file  ?Occupational History  ? Not on file  ?Tobacco Use  ? Smoking status: Former  ?  Packs/day: 0.50  ?  Years: 20.00  ?  Pack years: 10.00  ?  Types: Cigarettes  ?  Quit date: 04/05/2021  ?  Years since quitting: 0.3  ? Smokeless tobacco: Never  ?Substance and Sexual Activity  ? Alcohol  use: Not Currently  ? Drug use: Never  ? Sexual activity: Not on file  ?Other Topics Concern  ? Not on file  ?Social History Narrative  ? Not on file  ? ?Social Determinants of Health  ? ?Financial Resource Strain: Not on file  ?Food Insecurity: Not on file  ?Transportation Needs: Not on file  ?Physical Activity: Not on file  ?Stress: Not on file  ?Social Connections: Not on file  ?Intimate Partner Violence: Not on file  ?  ? ?Review of Systems: ?General: negative for chills, fever, night sweats or weight changes.  ?Cardiovascular: negative for chest pain, dyspnea on exertion, edema, orthopnea, palpitations, paroxysmal nocturnal dyspnea or shortness of breath ?Dermatological: negative for  rash ?Respiratory: negative for cough or wheezing ?Urologic: negative for hematuria ?Abdominal: negative for nausea, vomiting, diarrhea, bright red blood per rectum, melena, or hematemesis ?Neurologic: negative for visual changes, syncope, or dizziness ?All other systems reviewed and are otherwise negative except as noted above. ? ? ? ?Blood pressure 132/66, pulse 99, height 5\' 6"  (1.676 m), weight 269 lb 3.2 oz (122.1 kg), SpO2 92 %.  ?General appearance: alert and no distress ?Neck: no adenopathy, no carotid bruit, no JVD, supple, symmetrical, trachea midline, and thyroid not enlarged, symmetric, no tenderness/mass/nodules ?Lungs: clear to auscultation bilaterally ?Heart: regular rate and rhythm, S1, S2 normal, no murmur, click, rub or gallop ?Extremities: extremities normal, atraumatic, no cyanosis or edema ?Pulses: 2+ and symmetric ?Skin: Skin color, texture, turgor normal. No rashes or lesions ?Neurologic: Grossly normal ? ?EKG not performed today. ? ?ASSESSMENT AND PLAN:  ? ?STEMI involving right coronary artery (HCC) ?History of CAD status post inferior STEMI 04/04/2021.  I performed urgent radial diagnostic cath on him revealing occluded proximal dominant RCA which I stented with a 3.5 mm x 15 mm long Medtronic Onyx drug eluting eluting stent postdilated to 4 mm.  He did have a 70% left main which I elected to treat medically.  Since that time, he has stopped smoking and gotten his LDL down to 66.  He is completely asymptomatic.  We will continue to follow him clinically.  He remains on DAPT (aspirin and Brilinta). ? ?Hyperlipidemia LDL goal <70 ?History of hyperlipidemia on high-dose statin therapy with recent lipid profile performed 08/01/2021 revealing total cholesterol 128, LDL 66 and HDL 44. ? ?Hypertension ?History of essential hypertension a blood pressure measured today at 132/66.  He is on metoprolol. ? ?Tobacco abuse ?History of discontinue tobacco abuse having quit smoking on 04/06/2021 after  smoking over 100 pack years.  He does complain of some shortness of breath probably related to prior history of tobacco abuse. ? ?Morbid obesity (HCC) ?History of morbid obesity with a BMI of 43.  He is trying to lose weight with diet and exercise. ? ? ? ? ?04/08/2021 MD FACP,FACC,FAHA, FSCAI ?08/02/2021 ?10:26 AM ?

## 2021-08-02 NOTE — Assessment & Plan Note (Signed)
History of hyperlipidemia on high-dose statin therapy with recent lipid profile performed 08/01/2021 revealing total cholesterol 128, LDL 66 and HDL 44. ?

## 2021-08-02 NOTE — Assessment & Plan Note (Addendum)
History of CAD status post inferior STEMI 04/04/2021.  I performed urgent radial diagnostic cath on him revealing occluded proximal dominant RCA which I stented with a 3.5 mm x 15 mm long Medtronic Onyx drug eluting eluting stent postdilated to 4 mm.  He did have a 70% left main which I elected to treat medically.  Since that time, he has stopped smoking and gotten his LDL down to 66.  He is completely asymptomatic.  We will continue to follow him clinically.  He remains on DAPT (aspirin and Brilinta). ?

## 2021-08-02 NOTE — Assessment & Plan Note (Signed)
History of discontinue tobacco abuse having quit smoking on 04/06/2021 after smoking over 100 pack years.  He does complain of some shortness of breath probably related to prior history of tobacco abuse. ?

## 2021-08-02 NOTE — Patient Instructions (Signed)
Medication Instructions:  ?Your physician recommends that you continue on your current medications as directed. Please refer to the Current Medication list given to you today. ? ?*If you need a refill on your cardiac medications before your next appointment, please call your pharmacy* ? ? ?Follow-Up: ?At [facility], you and your health needs are our priority.  As part of our continuing mission to provide you with exceptional heart care, we have created designated Provider Care Teams.  These Care Teams include your primary Cardiologist (physician) and Advanced Practice Providers (APPs -  Physician Assistants and Nurse Practitioners) who all work together to provide you with the care you need, when you need it. ? ?We recommend signing up for the patient portal called "MyChart".  Sign up information is provided on this After Visit Summary.  MyChart is used to connect with patients for Virtual Visits (Telemedicine).  Patients are able to view lab/test results, encounter notes, upcoming appointments, etc.  Non-urgent messages can be sent to your provider as well.   ?To learn more about what you can do with MyChart, go to [email or web address].   ? ?Your next appointment:   ?6 month(s) ? ?The format for your next appointment:   ?In Person ? ?Provider:   ?Jesse Cleaver, FNP, Angela Duke, PA-C, Callie Goodrich, PA-C, Jennifer, Lambert, PA-C, Kathryn Lawrence, DNP, ANP, Hao Meng, PA-C, or Emily Monge, NP     ? ? ?Then, Jonathan Berry, MD will plan to see you again in 12 month(s). ?

## 2021-08-02 NOTE — Assessment & Plan Note (Signed)
History of morbid obesity with a BMI of 43.  He is trying to lose weight with diet and exercise. ?

## 2021-08-20 ENCOUNTER — Other Ambulatory Visit (HOSPITAL_COMMUNITY): Payer: Self-pay | Admitting: Nurse Practitioner

## 2021-08-20 ENCOUNTER — Other Ambulatory Visit: Payer: Self-pay | Admitting: Nurse Practitioner

## 2021-08-20 DIAGNOSIS — Z122 Encounter for screening for malignant neoplasm of respiratory organs: Secondary | ICD-10-CM

## 2021-08-21 ENCOUNTER — Other Ambulatory Visit (HOSPITAL_COMMUNITY): Payer: Self-pay | Admitting: Nurse Practitioner

## 2021-08-21 DIAGNOSIS — Z87891 Personal history of nicotine dependence: Secondary | ICD-10-CM

## 2021-08-22 ENCOUNTER — Encounter (INDEPENDENT_AMBULATORY_CARE_PROVIDER_SITE_OTHER): Payer: Self-pay | Admitting: *Deleted

## 2021-12-10 DIAGNOSIS — G5603 Carpal tunnel syndrome, bilateral upper limbs: Secondary | ICD-10-CM | POA: Insufficient documentation

## 2022-02-17 ENCOUNTER — Encounter (INDEPENDENT_AMBULATORY_CARE_PROVIDER_SITE_OTHER): Payer: Self-pay | Admitting: *Deleted

## 2022-02-17 ENCOUNTER — Other Ambulatory Visit: Payer: Self-pay | Admitting: Physician Assistant

## 2022-02-24 NOTE — Progress Notes (Unsigned)
Cardiology Office Note    Date:  02/25/2022   ID:  Dakota Odom, DOB Sep 01, 1955, MRN [id number]  PCP:  John Giovanni, MD  Cardiologist:  Dakota Batty, MD  Electrophysiologist:  None   Chief Complaint: f/u CAD  History of Present Illness:   Dakota Odom is a 66 y.o. male with history of CAD (inferior STEMI 03/2021 s/p DES to RCA with residual disease treated medically, HLD, former tobacco abuse, morbid obesity, family history of CAD, pre-DM, likely HTN who is seen for routine follow-up. He established with our team in 03/2021 when seen for inferior STEMI with occluded RCA treated with DES. He had otherwise 70% distal LM without significant disease in LAD/LCx, recommended for medical therapy. 2D echo 03/2021 EF 60-65%, moderate LVH, normal RV, aortic sclerosis without stenosis - per report "there may be slight hypokinesis of the mid inferior wall, incompletely visualized, but this is trivial." Last seen by Dr. Allyson Sabal 07/2021 and doing well. His PCP ordered low dose lung CA screening 08/2021 but he elected to defer for now due to cost.  He is seen for follow-up today. He has not had any recent angina. He reports very rare SL NTG use, only 3x over the last year. He has noticed his BP running higher today - was in 170s earlier today at PCP. Patient reports no med changes or labs at that time, pending today's visit with Dakota Odom. Initial BP here 140/76 with recheck 158/62. He continues to reports dyspnea on exertion which he states started shortly after his heart attack. He also has edema on exam which he states has been there since shortly after MI, though do not see previously documented on exam. He reports he gets winded easily with activity. He thought this would improve after quitting smoking, but it hasn't. He denies any acute change to this. He also has some intermittent orthopnea. He also reports snoring. He was eating a lot of fast food but recently his wife began packing his lunch because it got  to be very expensive.   Labwork independently reviewed: 07/2021 LFTs ok, LDL 66, trig 93 03/2021 Mg 2.2, WBC 12.6, Hgb 13.2, plt 244, K 4.7, Cr 0.4, glu 118, A1C 6.3, TSH wnl, Mg OK, LDL 140  Cardiology Studies:   Studies reviewed are outlined and summarized above. Reports included below if pertinent.   2D Echo 03/2021    1. Left ventricular ejection fraction, by estimation, is 60 to 65%. The  left ventricle has normal function. The left ventricle has no regional  wall motion abnormalities. There is moderate concentric left ventricular  hypertrophy. Left ventricular  diastolic function could not be evaluated.   2. Right ventricular systolic function is normal. The right ventricular  size is normal.   3. The mitral valve is grossly normal. No evidence of mitral valve  regurgitation. No evidence of mitral stenosis.   4. The aortic valve is grossly normal. There is moderate calcification of  the aortic valve. There is mild thickening of the aortic valve. Aortic  valve regurgitation is trivial. Aortic valve sclerosis/calcification is  present, without any evidence of  aortic stenosis.   5. The inferior vena cava is dilated in size with <50% respiratory  variability, suggesting right atrial pressure of 15 mmHg.   Comparison(s): No prior Echocardiogram.   Conclusion(s)/Recommendation(s): Otherwise normal echocardiogram, with  minor abnormalities described in the report. Overall normal LVEF. There  may be slight hypokinesis of the mid inferior wall, incompletely  visualized, but this  is trivial.   Cath 03/2021    Ost LM to Mid LM lesion is 70% stenosed.   1st Mrg lesion is 50% stenosed.   1st Diag lesion is 50% stenosed.   Prox RCA lesion is 100% stenosed.   A drug-eluting stent was successfully placed using a STENT ONYX FRONTIER 2.75X22.   Post intervention, there is a 0% residual stenosis.   IMPRESSION: Successful PCI drug-eluting stenting of an occluded dominant proximal RCA  using overlapping Medtronic Onyx drug-eluting stents.  The patient did have vomiting during and at the end of the case.  I was concerned that he may have vomited up his Brilinta and therefore bolused him with cangrelor and put him on a cangrelor drip.  We will reload him with p.o. Brilinta.  He will need uninterrupted DAPT for at least 12 months.  He in addition had a 70% distal left main which was short but no significant disease in the LAD and circumflex.  This will be treated medically at the current time.  2D echo will be ordered.  The patient was placed on high-dose atorvastatin, beta-blocker once his rhythm and heart rate have been documented to be stable.   Dakota Odom. MD, [facility] 04/04/2021 6:27 AM          Past Medical History:  Diagnosis Date   Hypertension    MI (myocardial infarction) [facility] [place])     Past Surgical History:  Procedure Laterality Date   CORONARY/GRAFT ACUTE MI REVASCULARIZATION N/A 04/04/2021   Procedure: Coronary/Graft Acute MI Revascularization;  Surgeon: Runell Gess, MD;  Location: [facility] INVASIVE CV LAB;  Service: Cardiovascular;  Laterality: N/A;   LEFT HEART CATH AND CORONARY ANGIOGRAPHY N/A 04/04/2021   Procedure: LEFT HEART CATH AND CORONARY ANGIOGRAPHY;  Surgeon: Runell Gess, MD;  Location: [facility] INVASIVE CV LAB;  Service: Cardiovascular;  Laterality: N/A;    Current Medications: Current Meds  Medication Sig   albuterol (VENTOLIN HFA) 108 (90 Base) MCG/ACT inhaler Inhale 2 puffs into the lungs every 4 (four) hours as needed.   aspirin EC 81 MG tablet Take 1 tablet (81 mg total) by mouth daily.   atorvastatin (LIPITOR) 80 MG tablet Take 1 tablet (80 mg total) by mouth daily.   cetirizine (ZYRTEC) 10 MG tablet Take 10 mg by mouth daily.   cyanocobalamin (VITAMIN B12) 1000 MCG tablet Take 2 tablets by mouth daily.   empagliflozin (JARDIANCE) 10 MG TABS tablet Take 1 tablet (10 mg total) by mouth daily.   fluticasone-salmeterol (ADVAIR) 250-50 MCG/ACT  AEPB Inhale 1 puff into the lungs in the morning and at bedtime.   hydrocortisone (ANUSOL-HC) 25 MG suppository Place 1 suppository (25 mg total) rectally 2 (two) times daily.   metoprolol succinate (TOPROL-XL) 25 MG 24 hr tablet TAKE (1) TABLET BY MOUTH DAILY.   Multiple Vitamin (MULTIVITAMIN WITH MINERALS) TABS tablet Take 1 tablet by mouth daily.   nitroGLYCERIN (NITROSTAT) 0.4 MG SL tablet Place 1 tablet (0.4 mg total) under the tongue every 5 (five) minutes x 3 doses as needed for chest pain.   ticagrelor (BRILINTA) 90 MG TABS tablet Take 1 tablet (90 mg total) by mouth 2 (two) times daily.      Allergies:   Patient has no known allergies.   Social History   Socioeconomic History   Marital status: Married    Spouse name: Not on file   Number of children: Not on file   Years of education: Not on file   Highest education level: Not  on file  Occupational History   Not on file  Tobacco Use   Smoking status: Former    Packs/day: 0.50    Years: 20.00    Total pack years: 10.00    Types: Cigarettes    Quit date: 04/05/2021    Years since quitting: 0.8   Smokeless tobacco: Never  Substance and Sexual Activity   Alcohol use: Not Currently   Drug use: Never   Sexual activity: Not on file  Other Topics Concern   Not on file  Social History Narrative   Not on file   Social Determinants of Health   Financial Resource Strain: Not on file  Food Insecurity: Not on file  Transportation Needs: Not on file  Physical Activity: Not on file  Stress: Not on file  Social Connections: Not on file     Family History:  The patient's family history includes CAD in his father.  ROS:   Please see the history of present illness.  All other systems are reviewed and otherwise negative.    EKG(s)/Additional Labs   EKG:  EKG is ordered today, personally reviewed, demonstrating NSR 78bpm, first degree AVB, prior inferior infarct, nonspecific STTW changes. Appears c/w prior evolution of  prior MI  Recent Labs: 04/04/2021: TSH 1.131 04/05/2021: BUN 14; Creatinine, Ser 0.94; Hemoglobin 13.2; Magnesium 2.2; Platelets 244; Potassium 4.7; Sodium 138 08/01/2021: ALT 21  Recent Lipid Panel    Component Value Date/Time   CHOL 128 08/01/2021 0759   TRIG 93 08/01/2021 0759   HDL 44 08/01/2021 0759   CHOLHDL 2.9 08/01/2021 0759   CHOLHDL 4.5 04/05/2021 0229   VLDL 17 04/05/2021 0229   LDLCALC 66 08/01/2021 0759    PHYSICAL EXAM:    VS:  BP (!) 158/62   Pulse 78   Ht 5\' 6"  (1.676 m)   Wt 282 lb (127.9 kg)   SpO2 100%   BMI 45.52 kg/m   BMI: Body mass index is 45.52 kg/m.  GEN: Well nourished, well developed male in no acute distress HEENT: normocephalic, atraumatic Neck: no JVD, carotid bruits, or masses Cardiac: RRR; soft SEM RUSB, no murmurs, rubs, or gallops, 1+ stiff BLE edema  Respiratory:  clear to auscultation bilaterally, normal work of breathing GI: soft, nontender, nondistended, + BS MS: no deformity or atrophy Skin: warm and dry, no rash Neuro:  Alert and Oriented x 3, Strength and sensation are intact, follows commands Psych: euthymic mood, full affect  Wt Readings from Last 3 Encounters:  02/25/22 282 lb (127.9 kg)  08/02/21 269 lb 3.2 oz (122.1 kg)  07/31/21 260 lb (117.9 kg)     ASSESSMENT & PLAN:   1. CAD, hyperlipidemia, former tobacco abuse - no recent accelerating angina, but continues with class 2-3 dyspnea. Remains on ASA, Brilinta. I will reach out to Dr. Allyson SabalBerry to inquire his recommendations for long term DAPT as he is approaching the 1 year most post MI. He does complain of DOE. Suspect this is multifactorial in setting of his obesity, prior tobacco abuse, residual CAD, as well as potential component of heart failure by examination as well. Difficult to exclude component of dyspnea from Brilinta as well. We will need to update his CBC as well. LDL at goal 07/2021, continue atorvastatin. Continue metoprolol - no wheezing on exam. Congratulated  him on remaining abstinent from smoking.  2. Dyspnea, lower extremity edema - this seems compatible with NYHA class II-III HFpEF. He's also had gradual weight gain. He also has a systolic  murmur on exam I do not see documented at prior visit so will update echocardiogram - could just be the aortic sclerosis we are hearing, but prudent to follow up. Will check basic panel labs and decide on HTN/diuretic therapy upon review. Initial O2 sat 93% upon coming back to exam table similar to prior values, improved to 100% by end of visit.  3. Essential HTN - suboptimal BP noted today. By history it looks like SBPs 130s-140s. Continue Jardiance, Toprol. Await labs to guide next steps for med rx. Also discussed lifestyle modifications as well.  4. Leukocytosis noted on labs - recheck today.  5. Snoring - habitus, snoring, edema consistent with suspected OSA. We discussed obtaining a sleep study and he is agreeable. He does not wish to do an Itamar test as the smartphone he has is a company phone. Also discussed mainstay of OSA management regardless being weight management.  Addendum: Labs came back showing new microcytic anemia with prior Hgb 13-14 down to 10.5. Suspect this is contributing to his SOB as well. Given the constellation if issues above with ongoing DAPT use <1 year out from MI with residual LM disease, evidence of volume overload on examination, as well as need to manage blood pressure, I am concerned this will be very difficult to safely manage as an outpatient. Have reached out to triage to advise patient proceed to ER now for w/u of anemia. Prior to the knowledge that he was anemic, Dr. Allyson Sabal had replied to my message with permission to switch Brilinta to Plavix with 300mg  load, but I reviewed with him after we saw his labs. He feels it would be OK for GI to hold Brilinta if needed for any procedures, but will await ER workup.    Disposition: F/u with me in 4-6 weeks. Pt prefers location  over [place] at this time due to proximity. He does live in [place] so long term may benefit from establishing there for ease of commute.   Medication Adjustments/Labs and Tests Ordered: Current medicines are reviewed at length with the patient today.  Concerns regarding medicines are outlined above. Medication changes, Labs and Tests ordered today are summarized above and listed in the Patient Instructions accessible in Encounters.   Signed, Garrison, PA-C  02/25/2022 4:18 PM    [facility] Phone: [phone]; Fax: [phone]

## 2022-02-25 ENCOUNTER — Ambulatory Visit: Payer: BC Managed Care – PPO | Attending: Physician Assistant | Admitting: Physician Assistant

## 2022-02-25 ENCOUNTER — Encounter: Payer: Self-pay | Admitting: Physician Assistant

## 2022-02-25 VITALS — BP 158/62 | HR 78 | Ht 66.0 in | Wt 282.0 lb

## 2022-02-25 DIAGNOSIS — R600 Localized edema: Secondary | ICD-10-CM

## 2022-02-25 DIAGNOSIS — R0600 Dyspnea, unspecified: Secondary | ICD-10-CM

## 2022-02-25 DIAGNOSIS — D72829 Elevated white blood cell count, unspecified: Secondary | ICD-10-CM

## 2022-02-25 DIAGNOSIS — I10 Essential (primary) hypertension: Secondary | ICD-10-CM

## 2022-02-25 DIAGNOSIS — R7303 Prediabetes: Secondary | ICD-10-CM

## 2022-02-25 DIAGNOSIS — R0683 Snoring: Secondary | ICD-10-CM

## 2022-02-25 DIAGNOSIS — Z720 Tobacco use: Secondary | ICD-10-CM | POA: Diagnosis not present

## 2022-02-25 DIAGNOSIS — R011 Cardiac murmur, unspecified: Secondary | ICD-10-CM

## 2022-02-25 DIAGNOSIS — I2510 Atherosclerotic heart disease of native coronary artery without angina pectoris: Secondary | ICD-10-CM

## 2022-02-25 DIAGNOSIS — E785 Hyperlipidemia, unspecified: Secondary | ICD-10-CM | POA: Diagnosis not present

## 2022-02-25 NOTE — Patient Instructions (Addendum)
Medication Instructions:   Your physician recommends that you continue on your current medications as directed. Please refer to the Current Medication list given to you today.   *If you need a refill on your cardiac medications before your next appointment, please call your pharmacy*   Lab Work: CMET  BNP CBC ANDS TSH TODAY    If you have labs (blood work) drawn today and your tests are completely normal, you will receive your results only by: MyChart Message (if you have MyChart) OR A paper copy in the mail If you have any lab test that is abnormal or we need to change your treatment, we will call you to review the results.   Testing/Procedures: Your physician has requested that you have an echocardiogram. Echocardiography is a painless test that uses sound waves to create images of your heart. It provides your doctor with information about the size and shape of your heart and how well your heart's chambers and valves are working. This procedure takes approximately one hour. There are no restrictions for this procedure. Please do NOT wear cologne, perfume, aftershave, or lotions (deodorant is allowed). Please arrive 15 minutes prior to your appointment time.   Your physician has recommended that you have a sleep study. This test records several body functions during sleep, including: brain activity, eye movement, oxygen and carbon dioxide blood levels, heart rate and rhythm, breathing rate and rhythm, the flow of air through your mouth and nose, snoring, body muscle movements, and chest and belly movement.   Follow-Up: At [facility], you and your health needs are our priority.  As part of our continuing mission to provide you with exceptional heart care, we have created designated Provider Care Teams.  These Care Teams include your primary Cardiologist (physician) and Advanced Practice Providers (APPs -  Physician Assistants and Nurse Practitioners) who all work together to  provide you with the care you need, when you need it.  We recommend signing up for the patient portal called "MyChart".  Sign up information is provided on this After Visit Summary.  MyChart is used to connect with patients for Virtual Visits (Telemedicine).  Patients are able to view lab/test results, encounter notes, upcoming appointments, etc.  Non-urgent messages can be sent to your provider as well.   To learn more about what you can do with MyChart, go to [email or web address].    Your next appointment:   1 month(s)  The format for your next appointment:   In Person  Provider:   Ronie Spies, PA-C     Other Instructions   Important Information About Sugar

## 2022-02-26 ENCOUNTER — Emergency Department (HOSPITAL_COMMUNITY): Payer: BC Managed Care – PPO

## 2022-02-26 ENCOUNTER — Encounter (HOSPITAL_COMMUNITY): Payer: Self-pay | Admitting: Emergency Medicine

## 2022-02-26 ENCOUNTER — Other Ambulatory Visit: Payer: Self-pay

## 2022-02-26 ENCOUNTER — Emergency Department (HOSPITAL_COMMUNITY)
Admission: EM | Admit: 2022-02-26 | Discharge: 2022-02-26 | Disposition: A | Discharge status: Home / Self Care | Payer: BC Managed Care – PPO | Attending: Emergency Medicine | Admitting: Emergency Medicine

## 2022-02-26 ENCOUNTER — Telehealth: Payer: Self-pay | Admitting: Physician Assistant

## 2022-02-26 DIAGNOSIS — Z7982 Long term (current) use of aspirin: Secondary | ICD-10-CM | POA: Insufficient documentation

## 2022-02-26 DIAGNOSIS — I10 Essential (primary) hypertension: Secondary | ICD-10-CM | POA: Diagnosis not present

## 2022-02-26 DIAGNOSIS — D649 Anemia, unspecified: Secondary | ICD-10-CM

## 2022-02-26 DIAGNOSIS — M7989 Other specified soft tissue disorders: Secondary | ICD-10-CM

## 2022-02-26 DIAGNOSIS — R7303 Prediabetes: Secondary | ICD-10-CM

## 2022-02-26 DIAGNOSIS — R799 Abnormal finding of blood chemistry, unspecified: Secondary | ICD-10-CM | POA: Diagnosis not present

## 2022-02-26 DIAGNOSIS — Z79899 Other long term (current) drug therapy: Secondary | ICD-10-CM | POA: Insufficient documentation

## 2022-02-26 DIAGNOSIS — R2243 Localized swelling, mass and lump, lower limb, bilateral: Secondary | ICD-10-CM | POA: Insufficient documentation

## 2022-02-26 DIAGNOSIS — Z87891 Personal history of nicotine dependence: Secondary | ICD-10-CM | POA: Insufficient documentation

## 2022-02-26 DIAGNOSIS — D509 Iron deficiency anemia, unspecified: Secondary | ICD-10-CM | POA: Diagnosis not present

## 2022-02-26 LAB — COMPREHENSIVE METABOLIC PANEL
ALT: 25 IU/L (ref 0–44)
AST: 26 IU/L (ref 0–40)
Albumin/Globulin Ratio: 1.4 (ref 1.2–2.2)
Albumin: 4.1 g/dL (ref 3.9–4.9)
Alkaline Phosphatase: 96 IU/L (ref 44–121)
BUN/Creatinine Ratio: 14 (ref 10–24)
BUN: 14 mg/dL (ref 8–27)
Bilirubin Total: 0.4 mg/dL (ref 0.0–1.2)
CO2: 22 mmol/L (ref 20–29)
Calcium: 9 mg/dL (ref 8.6–10.2)
Chloride: 106 mmol/L (ref 96–106)
Creatinine, Ser: 0.97 mg/dL (ref 0.76–1.27)
Globulin, Total: 2.9 g/dL (ref 1.5–4.5)
Glucose: 83 mg/dL (ref 70–99)
Potassium: 4.4 mmol/L (ref 3.5–5.2)
Sodium: 141 mmol/L (ref 134–144)
Total Protein: 7 g/dL (ref 6.0–8.5)
eGFR: 86 mL/min/{1.73_m2} (ref >59)

## 2022-02-26 LAB — CBC WITH DIFFERENTIAL/PLATELET
Abs Immature Granulocytes: 0.04 10*3/uL (ref 0.00–0.07)
Basophils Absolute: 0 10*3/uL (ref 0.0–0.1)
Basophils Relative: 1 %
Eosinophils Absolute: 0.1 10*3/uL (ref 0.0–0.5)
Eosinophils Relative: 1 %
HCT: 35.4 % — ABNORMAL LOW (ref 39.0–52.0)
Hemoglobin: 10.2 g/dL — ABNORMAL LOW (ref 13.0–17.0)
Immature Granulocytes: 1 %
Lymphocytes Relative: 21 %
Lymphs Abs: 1.8 10*3/uL (ref 0.7–4.0)
MCH: 19.4 pg — ABNORMAL LOW (ref 26.0–34.0)
MCHC: 28.8 g/dL — ABNORMAL LOW (ref 30.0–36.0)
MCV: 67.4 fL — ABNORMAL LOW (ref 80.0–100.0)
Monocytes Absolute: 0.7 10*3/uL (ref 0.1–1.0)
Monocytes Relative: 9 %
Neutro Abs: 5.9 10*3/uL (ref 1.7–7.7)
Neutrophils Relative %: 67 %
Platelets: 320 10*3/uL (ref 150–400)
RBC: 5.25 MIL/uL (ref 4.22–5.81)
RDW: 20.7 % — ABNORMAL HIGH (ref 11.5–15.5)
WBC: 8.6 10*3/uL (ref 4.0–10.5)
nRBC: 0 % (ref 0.0–0.2)

## 2022-02-26 LAB — CBC
Hematocrit: 36.6 % — ABNORMAL LOW (ref 37.5–51.0)
Hemoglobin: 10.5 g/dL — ABNORMAL LOW (ref 13.0–17.7)
MCH: 19.3 pg — ABNORMAL LOW (ref 26.6–33.0)
MCHC: 28.7 g/dL — ABNORMAL LOW (ref 31.5–35.7)
MCV: 67 fL — ABNORMAL LOW (ref 79–97)
Platelets: 368 10*3/uL (ref 150–450)
RBC: 5.45 x10E6/uL (ref 4.14–5.80)
RDW: 19.4 % — ABNORMAL HIGH (ref 11.6–15.4)
WBC: 8.4 10*3/uL (ref 3.4–10.8)

## 2022-02-26 LAB — BASIC METABOLIC PANEL
Anion gap: 7 (ref 5–15)
BUN: 15 mg/dL (ref 8–23)
CO2: 22 mmol/L (ref 22–32)
Calcium: 8.7 mg/dL — ABNORMAL LOW (ref 8.9–10.3)
Chloride: 109 mmol/L (ref 98–111)
Creatinine, Ser: 0.83 mg/dL (ref 0.61–1.24)
GFR, Estimated: >60 mL/min (ref >60)
Glucose, Bld: 92 mg/dL (ref 70–99)
Potassium: 3.7 mmol/L (ref 3.5–5.1)
Sodium: 138 mmol/L (ref 135–145)

## 2022-02-26 LAB — BRAIN NATRIURETIC PEPTIDE: B Natriuretic Peptide: 26 pg/mL (ref 0.0–100.0)

## 2022-02-26 LAB — IRON AND TIBC
Iron: 26 ug/dL — ABNORMAL LOW (ref 45–182)
Saturation Ratios: 6 % — ABNORMAL LOW (ref 17.9–39.5)
TIBC: 446 ug/dL (ref 250–450)
UIBC: 420 ug/dL

## 2022-02-26 LAB — PROTIME-INR
INR: 1 (ref 0.8–1.2)
Prothrombin Time: 13.3 seconds (ref 11.4–15.2)

## 2022-02-26 LAB — TSH: TSH: 1.69 u[IU]/mL (ref 0.450–4.500)

## 2022-02-26 LAB — VITAMIN B12: Vitamin B-12: 630 pg/mL (ref 180–914)

## 2022-02-26 LAB — POC OCCULT BLOOD, ED: Fecal Occult Bld: NEGATIVE

## 2022-02-26 LAB — PRO B NATRIURETIC PEPTIDE: NT-Pro BNP: 76 pg/mL (ref 0–376)

## 2022-02-26 LAB — FOLATE: Folate: 30 ng/mL (ref >5.9)

## 2022-02-26 LAB — RETICULOCYTES
Immature Retic Fract: 32.1 % — ABNORMAL HIGH (ref 2.3–15.9)
RBC.: 5.3 MIL/uL (ref 4.22–5.81)
Retic Count, Absolute: 100.2 10*3/uL (ref 19.0–186.0)
Retic Ct Pct: 1.9 % (ref 0.4–3.1)

## 2022-02-26 LAB — FERRITIN: Ferritin: 5 ng/mL — ABNORMAL LOW (ref 24–336)

## 2022-02-26 MED ORDER — FERROUS SULFATE 325 (65 FE) MG PO TABS: 325.0000 mg | ORAL_TABLET | Freq: Every day | ORAL | 0 refills | Status: AC

## 2022-02-26 NOTE — Discharge Instructions (Addendum)
It was a pleasure caring for you today in the emergency department.  Please follow-up with your PCP in the next 5 to 7 days for repeat hemoglobin level  Please use compression stockings, elevate legs when sitting or lying down  Please return to the emergency department for any worsening or worrisome symptoms.

## 2022-02-26 NOTE — Telephone Encounter (Addendum)
I spoke to patient and relayed my concerns about his new anemia, especially since it is difficult to know whether we are able to continue his antiplatelet regimen or not and if there is evidence of ongoing bleeding somewhere. He understands the rationale for proceeding and plans to go to [facility] as that is the closest location to him. We will need to await his workup there. Would consider admission for GI workup and expedited echocardiogram given edema on exam yesterday. I spoke with Dr. Allyson Sabal again about his blood thinner situation so that there are recommendations in the chart for the ED to enact depending on workup. If he is seen by GI at the hospital and needs to hold Brilinta temporarily for endoscopy/colonoscopy, OK to do so from our standpoint. However, if they feel there is no evidence of bleeding, he would suggest switching Brilinta to Plavix with 300mg  load on day 1 then 75mg  daily thereafter. Dr. does think continued DAPT is warranted given the fact that he has residual 70% distal LM disease. If for some reason he is discharged from the ER, would consider ER addition of torsemide 20mg  daily for control of edema and HTN if BP warrants, with BMET/CBC in 1 week and moving up cardiology f/u sooner than scheduled on 12/6, as well as expedited GI/PCP f/u of anemia.  If workup not completed by end of day, will need to review plans in AM. I will be out of the office starting 11/16 - will try to review before I leave tomorrow but otherwise will leave this phone note for my covering colleague to review and help aid in moving follow-up as above if needed. Otherwise has OP echo scheduled 11/20, but await ER/hospital workup.

## 2022-02-26 NOTE — ED Notes (Signed)
Graham crackers, peanut butter and ginger ale given to pt per request and Dr approval.

## 2022-02-26 NOTE — Telephone Encounter (Signed)
Patient returning call for lab results. 

## 2022-02-26 NOTE — ED Notes (Signed)
Went over [employer]. All questions answered. Ambulatory to lobby with wife

## 2022-02-26 NOTE — ED Provider Notes (Signed)
[facility] EMERGENCY DEPARTMENT Provider Note   CSN: [id number] Arrival date & time: 02/26/22  1330     History  Chief Complaint  Patient presents with   Abnormal Lab   Leg Swelling    Dakota Odom is a 66 y.o. male.  Patient as above with significant medical history as below, including MI, HTN who presents to the ED with complaint of abnormal lab. Pt W/ received routine lab work at cardiologist office found to have drop in hgb 10.2, down from 13-14 baseline. He has dyspnea ongoing over the last year following his heart attack last December. He feels his dyspnea has not worsened in the last few weeks, he denies weakness or feelings of malaise, no chest pain or abd pain. Denies any known bleeding to rectum or gu, no vomiting blood or increased bruising. He does have external hemorrhoids but denies and bleeding noted there. He had last echo in December following MI, EF 60-65%.  Ongoing dib, not worsened in last few mos. Has been present since his MI; on brilinta/asa w/ good compliance      Past Medical History:  Diagnosis Date   Hypertension    MI (myocardial infarction) [facility])     Past Surgical History:  Procedure Laterality Date   CORONARY/GRAFT ACUTE MI REVASCULARIZATION N/A 04/04/2021   Procedure: Coronary/Graft Acute MI Revascularization;  Surgeon: Runell Gess, MD;  Location: [facility] INVASIVE CV LAB;  Service: Cardiovascular;  Laterality: N/A;   LEFT HEART CATH AND CORONARY ANGIOGRAPHY N/A 04/04/2021   Procedure: LEFT HEART CATH AND CORONARY ANGIOGRAPHY;  Surgeon: Runell Gess, MD;  Location: [facility] INVASIVE CV LAB;  Service: Cardiovascular;  Laterality: N/A;     The history is provided by the patient. No language interpreter was used.  Abnormal Lab      Home Medications Prior to Admission medications   Medication Sig Start Date End Date Taking? Authorizing Provider  albuterol (VENTOLIN HFA) 108 (90 Base) MCG/ACT inhaler Inhale 2 puffs into the lungs every 4  (four) hours as needed.   Yes [provider]  aspirin EC 81 MG tablet Take 1 tablet (81 mg total) by mouth daily. 04/06/21  Yes Azalee Course, PA  atorvastatin (LIPITOR) 80 MG tablet Take 1 tablet (80 mg total) by mouth daily. 04/06/21  Yes Azalee Course, PA  cetirizine (ZYRTEC) 10 MG tablet Take 10 mg by mouth daily.   Yes [provider]  cyanocobalamin (VITAMIN B12) 1000 MCG tablet Take 2 tablets by mouth daily.   Yes [provider]  empagliflozin (JARDIANCE) 10 MG TABS tablet Take 1 tablet (10 mg total) by mouth daily. 04/06/21  Yes Azalee Course, PA  ferrous sulfate 325 (65 FE) MG tablet Take 1 tablet (325 mg total) by mouth daily. 02/26/22  Yes Tanda Rockers A, DO  fluticasone-salmeterol (ADVAIR) 250-50 MCG/ACT AEPB Inhale 1 puff into the lungs in the morning and at bedtime.   Yes [provider]  metoprolol succinate (TOPROL-XL) 25 MG 24 hr tablet TAKE (1) TABLET BY MOUTH DAILY. Patient taking differently: Take 25 mg by mouth daily. 02/17/22  Yes Runell Gess, MD  Multiple Vitamin (MULTIVITAMIN WITH MINERALS) TABS tablet Take 1 tablet by mouth daily.   Yes [provider]  nitroGLYCERIN (NITROSTAT) 0.4 MG SL tablet Place 1 tablet (0.4 mg total) under the tongue every 5 (five) minutes x 3 doses as needed for chest pain. 04/06/21  Yes Azalee Course, PA  ticagrelor (BRILINTA) 90 MG TABS tablet Take 1 tablet (  90 mg total) by mouth 2 (two) times daily. 04/06/21  Yes Azalee Course, PA  hydrocortisone (ANUSOL-HC) 25 MG suppository Place 1 suppository (25 mg total) rectally 2 (two) times daily. Patient not taking: Reported on 02/26/2022 07/31/21   Peter Garter, PA      Allergies    Patient has no known allergies.    Review of Systems   Review of Systems  Constitutional:  Negative for chills and fever.  HENT:  Negative for facial swelling and trouble swallowing.   Eyes:  Negative for photophobia and visual disturbance.  Respiratory:  Positive for shortness of  breath. Negative for cough.   Cardiovascular:  Negative for chest pain and palpitations.  Gastrointestinal:  Negative for abdominal pain, nausea and vomiting.  Endocrine: Negative for polydipsia and polyuria.  Genitourinary:  Negative for difficulty urinating and hematuria.  Musculoskeletal:  Negative for gait problem and joint swelling.  Skin:  Negative for pallor and rash.  Neurological:  Negative for syncope and headaches.  Psychiatric/Behavioral:  Negative for agitation and confusion.     Physical Exam Updated Vital Signs BP (!) 125/58   Pulse 74   Temp 98 F (36.7 C) (Oral)   Resp 18   SpO2 97%  Physical Exam Vitals and nursing note reviewed. Exam conducted with a chaperone present.  Constitutional:      General: He is not in acute distress.    Appearance: He is well-developed. He is obese.  HENT:     Head: Normocephalic and atraumatic.     Right Ear: External ear normal.     Left Ear: External ear normal.     Mouth/Throat:     Mouth: Mucous membranes are moist.  Eyes:     General: No scleral icterus. Cardiovascular:     Rate and Rhythm: Normal rate and regular rhythm.     Pulses: Normal pulses.          Dorsalis pedis pulses are 2+ on the right side and 2+ on the left side.     Heart sounds: Normal heart sounds.  Pulmonary:     Effort: Pulmonary effort is normal. No respiratory distress.     Breath sounds: Normal breath sounds. No wheezing.  Abdominal:     General: Abdomen is flat.     Palpations: Abdomen is soft.     Tenderness: There is no abdominal tenderness.  Genitourinary:    Comments: External hemorrhoids noted, not thrombosed, no frank bleeding on exam, no melena on internal exam Musculoskeletal:        General: Normal range of motion.     Cervical back: Normal range of motion.     Right lower leg: Edema (trace) present.     Left lower leg: Edema (trace) present.  Skin:    General: Skin is warm and dry.     Capillary Refill: Capillary refill takes  less than 2 seconds.  Neurological:     Mental Status: He is alert and oriented to person, place, and time.  Psychiatric:        Mood and Affect: Mood normal.        Behavior: Behavior normal.     ED Results / Procedures / Treatments   Labs (all labs ordered are listed, but only abnormal results are displayed) Labs Reviewed  CBC WITH DIFFERENTIAL/PLATELET - Abnormal; Notable for the following components:      Result Value   Hemoglobin 10.2 (*)    HCT 35.4 (*)    MCV 67.4 (*)  MCH 19.4 (*)    MCHC 28.8 (*)    RDW 20.7 (*)    All other components within normal limits  BASIC METABOLIC PANEL - Abnormal; Notable for the following components:   Calcium 8.7 (*)    All other components within normal limits  IRON AND TIBC - Abnormal; Notable for the following components:   Iron 26 (*)    Saturation Ratios 6 (*)    All other components within normal limits  FERRITIN - Abnormal; Notable for the following components:   Ferritin 5 (*)    All other components within normal limits  RETICULOCYTES - Abnormal; Notable for the following components:   Immature Retic Fract 32.1 (*)    All other components within normal limits  VITAMIN B12  FOLATE  PROTIME-INR  BRAIN NATRIURETIC PEPTIDE  OCCULT BLOOD X 1 CARD TO LAB, STOOL  POC OCCULT BLOOD, ED    EKG None  Radiology DG Chest Portable 1 View  Result Date: 02/26/2022 CLINICAL DATA:  Anemia.  No chest complaints. EXAM: PORTABLE CHEST 1 VIEW COMPARISON:  Chest x-ray dated April 04, 2021. FINDINGS: The heart remains at the upper limits of normal in size. Normal pulmonary vascularity. No focal consolidation, pleural effusion, or pneumothorax. No acute osseous abnormality. IMPRESSION: No active disease. Electronically Signed   By: Obie Dredge M.D.   On: 02/26/2022 16:37    Procedures Procedures    Medications Ordered in ED Medications - No data to display  ED Course/ Medical Decision Making/ A&P Clinical Course as of 02/26/22  1936  Wed Feb 26, 2022  1615 Fecal Occult Blood, POC: NEGATIVE No melena on exam [SG]    Clinical Course User Index [SG] Sloan Leiter, DO                           Medical Decision Making Amount and/or Complexity of Data Reviewed Labs: ordered. Decision-making details documented in ED Course. Radiology: ordered.  Risk OTC drugs.   This patient presents to the ED with chief complaint(s) of abnormal labs with pertinent past medical history of cad, htn, mi which further complicates the presenting complaint. The complaint involves an extensive differential diagnosis and also carries with it a high risk of complications and morbidity.    The differential diagnosis includes but not limited to anemia, bleeding, gi source, medication effect, dietary, IDA, neoplastic, metabolic, chf, etc. Serious etiologies were considered.   The initial plan is to screening labs, fobt   Additional history obtained: Additional history obtained from  na Records reviewed Primary Care Documents and home meds, prior labs/imaging  (he is on brilita)  Independent labs interpretation:  The following labs were independently interpreted:  Hemoccult neg Hgb 10.2, yestd 10.5, baseline around 13-14 Microcytic; will send anemia panel; consistent with iron deficiency anemia BNP is not elevated> chest x-ray unremarkable  Independent visualization of imaging: - I independently visualized the following imaging with scope of interpretation limited to determining acute life threatening conditions related to emergency care: Chest x-ray, which revealed no acute process  Cardiac monitoring was reviewed and interpreted by myself which shows nsr  Treatment and Reassessment: Stayed the same  Consultation: - Consulted or discussed management/test interpretation w/ external professional: na  Consideration for admission or further workup: Admission was considered    Patient here from cardiologist office secondary to  low hemoglobin.  Today hemoglobin 10.2, baseline around 13.  He denies bleeding of any source, no melena, BRBPR.  No increased bruising or vomiting blood.  Denies history of anemia in the past.  No history of blood transfusion that he is aware of. FOBT negative. No melena on exam. Labs consistent with iron deficiency anemia, will start iron supplementation.  Advise he follow-up with his PCP in the next 5 to 7 days for recheck and further evaluation.   Cardiology office also concerned regarding his leg swelling and chronic difficulty breathing over the past year.  He has trace edema bilateral lower extremities, no difficulty breathing, no orthopnea, he is not hypoxic.  Chest x-ray is unremarkable, BNP is elevated.  I have low suspicion for acute CHF exacerbation, he is scheduled for outpatient echocardiogram via cardiology.  Advised to follow-up with cardiologist regarding this. Encouraged compression stocking use, keep legs elevated at night time/when at rest. He is agreeable.   The patient improved significantly and was discharged in stable condition. Detailed discussions were had with the patient regarding current findings, and need for close f/u with PCP or on call doctor. The patient has been instructed to return immediately if the symptoms worsen in any way for re-evaluation. Patient verbalized understanding and is in agreement with current care plan. All questions answered prior to discharge.    Social Determinants of health: Social History   Tobacco Use   Smoking status: Former    Packs/day: 0.50    Years: 20.00    Total pack years: 10.00    Types: Cigarettes    Quit date: 04/05/2021    Years since quitting: 0.8   Smokeless tobacco: Never  Substance Use Topics   Alcohol use: Not Currently   Drug use: Never            Final Clinical Impression(s) / ED Diagnoses Final diagnoses:  Microcytic anemia  Iron deficiency anemia, unspecified iron deficiency anemia type  Leg swelling     Rx / DC Orders ED Discharge Orders          Ordered    ferrous sulfate 325 (65 FE) MG tablet  Daily        02/26/22 1929              Sloan LeiterGray, Deundra Furber A, DO 02/26/22 1937

## 2022-02-26 NOTE — Telephone Encounter (Signed)
Pt called in reviewed results. "Please let patient know I am very concerned that he is now significantly anemic, with blood count going from prior values of 13-14 down to 10.5 with possible significant iron deficiency. Given that he remains on blood thinner since he is less than 1 year out from his heart attack, this becomes an incredibly difficult thing to manage as an outpatient, especially since his exam yesterday was also consistent with high blood pressure and signs of heart failure that also need to be managed at the same time. I would recommend he proceed to the ER for further evaluation of his anemia to check for bleeding and consideration of expedited GI evaluation. He may need to be admitted for this depending on the workup. "  Reports will loose job if he goes to ER now.   Denies blood in stool/ urine.  No noted bleeding.  Denies feeling fatigued at this time. Pt would like a call from provider to discuss results.  Will route to Provider to follow up.

## 2022-02-26 NOTE — ED Triage Notes (Addendum)
Pt had cardiology check up yesterday. Was told to come to ed today due to HGB 10.5. pt denies any symptoms. Pt denies weakness or sob. Color wnl. Nad. Denies black or bloody stools.  BLE swelling noted

## 2022-02-27 ENCOUNTER — Encounter: Payer: Self-pay | Admitting: Gastroenterology

## 2022-02-27 MED ORDER — CLOPIDOGREL BISULFATE 75 MG PO TABS: ORAL_TABLET | ORAL | 3 refills | Status: DC

## 2022-02-27 MED ORDER — FUROSEMIDE 20 MG PO TABS: ORAL_TABLET | ORAL | 3 refills | Status: DC

## 2022-02-27 MED ORDER — POTASSIUM CHLORIDE CRYS ER 20 MEQ PO TBCR: EXTENDED_RELEASE_TABLET | ORAL | 3 refills | Status: DC

## 2022-02-27 NOTE — Telephone Encounter (Signed)
Reviewed recommendations with pt and pt verbalized understanding./cy

## 2022-02-27 NOTE — Telephone Encounter (Signed)
Please call patient and let him know we reviewed his ER workup. Very grateful to see that stool was negative for acute blood loss. However, the anemia + low iron level are still concerning and need further investigation as outpatient. In discussion with Dr. Allyson Sabal yesterday, please enact the following:  - Can stop Brilinta, taking the last dose as an evening dose - The morning after last PM dose of Brilinta, start clopidogrel (Plavix) 75mg  taking 4 tablets by mouth once (300mg ) - The day after that, start taking clopidogrel 75mg  once daily thereafter - Trial of Lasix 20mg  daily with potassium daily for 3 days then just daily PRN for both thereafter for increased leg swelling - Blood pressure looked much better in the ER so we will hold off adding a standing BP med - Refer to GI ASAP for microcytic anemia - CBC/BMET 1 week - Return to ER for any black stools, tarry stools, blood in stool, worsened shortness of breath or feeling faint - Otherwise continue plan as discussed

## 2022-02-28 ENCOUNTER — Telehealth: Payer: Self-pay | Admitting: *Deleted

## 2022-02-28 ENCOUNTER — Other Ambulatory Visit: Payer: Self-pay | Admitting: Physician Assistant

## 2022-02-28 ENCOUNTER — Other Ambulatory Visit: Payer: Self-pay | Admitting: *Deleted

## 2022-02-28 DIAGNOSIS — I10 Essential (primary) hypertension: Secondary | ICD-10-CM

## 2022-02-28 DIAGNOSIS — R0683 Snoring: Secondary | ICD-10-CM

## 2022-02-28 NOTE — Telephone Encounter (Signed)
Prior Authorization for [employer] sent to [employer] Writer) via web portal. Insurance denied in lab study. Per Marjie Skiff, who is covering for Ronie Spies ok to switch to [employer]. Approval Number [id number]. Valid dates 02/28/22 to 04/28/22.

## 2022-03-03 ENCOUNTER — Ambulatory Visit (HOSPITAL_COMMUNITY): Admission: RE | Admit: 2022-03-03 | Payer: BC Managed Care – PPO | Source: Ambulatory Visit

## 2022-03-10 ENCOUNTER — Encounter (HOSPITAL_COMMUNITY): Payer: Self-pay | Admitting: Physician Assistant

## 2022-03-11 ENCOUNTER — Encounter: Payer: BC Managed Care – PPO | Admitting: Cardiovascular Disease

## 2022-03-13 NOTE — Progress Notes (Signed)
Cardiology Office Note    Date:  03/19/2022   ID:  YAN RICHARDSON, DOB [birth year], MRN [id number]  PCP:  Leslie Andrea, MD  Cardiologist:  Quay Burow, MD  Electrophysiologist:  None   Chief Complaint: f/u SOB  History of Present Illness:   Dakota Odom is a 66 y.o. male with history of CAD (inferior STEMI 03/2021 s/p DES to RCA with residual disease treated medically, HLD, former tobacco abuse, morbid obesity, family history of CAD, pre-DM, likely HTN who is seen for routine follow-up. He established with our team in 03/2021 when seen for inferior STEMI with occluded RCA treated with DES. He had otherwise 70% distal LM without significant disease in LAD/LCx, recommended for medical therapy. 2D echo 03/2021 EF 60-65%, moderate LVH, normal RV, aortic sclerosis without stenosis - per report "there may be slight hypokinesis of the mid inferior wall, incompletely visualized, but this is trivial." Last seen by Dr. Gwenlyn Found 07/2021 and doing well. His PCP ordered low dose lung CA screening 08/2021 but he did not have this done due to cost.   He was recently seen back in clinic 02/2022 overall doing OK though with persistent DOE that had been present since his MI. BP was elevated. He also had LE edema on exam. We ordered echo and sleep study. I had reached out to Dr. Gwenlyn Found about transitioning from [facility] to Plavix. However, labs came back demonstrating new microcytic anemia in the 10 range so he was sent to the ER to rule out GIB prior to loading with Plavix. FOBT was negative and he was discharged with recommendation for OP GI f/u. We did adjust hits meds to switch from Brilinta to Plavix, with trial of short course of Lasix/KCl for edema/HTN. He did not return for f/u bloodwork or echo.  He is seen back for follow-up overall feeling better. He reports improvement in his SOB. He has more stamina and no longer wakes up SOB. Edema has improved. He's continued to take the Lasix daily rather than PRN.  He's not sure if he is taking KCl. In retrospect he was also not taking his Advair so also restarted that. He reports h/o spotty hemorrhoidal bleeding, no brisk symptoms of GIB. He did not have his echo, sleep study or GI appointment yet, citing that he deferred due to cost. We discussed the importance of followup specifically for his anemia. His last colonoscopy was age 58.   Labwork independently reviewed: 02/2022 TSH wnl, Hgb 10.5->10.2, plt OK, BNP 76, LFTs ok 07/2021 LFTs ok, LDL 66, trig 93 03/2021 Mg 2.2, WBC 12.6, Hgb 13.2, plt 244, K 4.7, Cr 0.4, glu 118, A1C 6.3, TSH wnl, Mg OK, LDL 140   Cardiology Studies:   Studies reviewed are outlined and summarized above. Reports included below if pertinent.   2D Echo 03/2021   1. Left ventricular ejection fraction, by estimation, is 60 to 65%. The  left ventricle has normal function. The left ventricle has no regional  wall motion abnormalities. There is moderate concentric left ventricular  hypertrophy. Left ventricular  diastolic function could not be evaluated.   2. Right ventricular systolic function is normal. The right ventricular  size is normal.   3. The mitral valve is grossly normal. No evidence of mitral valve  regurgitation. No evidence of mitral stenosis.   4. The aortic valve is grossly normal. There is moderate calcification of  the aortic valve. There is mild thickening of the aortic valve. Aortic  valve regurgitation is  trivial. Aortic valve sclerosis/calcification is  present, without any evidence of  aortic stenosis.   5. The inferior vena cava is dilated in size with <50% respiratory  variability, suggesting right atrial pressure of 15 mmHg.   Comparison(s): No prior Echocardiogram.   Conclusion(s)/Recommendation(s): Otherwise normal echocardiogram, with  minor abnormalities described in the report. Overall normal LVEF. There  may be slight hypokinesis of the mid inferior wall, incompletely  visualized, but  this is trivial.    Cath 03/2021    Ost LM to Mid LM lesion is 70% stenosed.   1st Mrg lesion is 50% stenosed.   1st Diag lesion is 50% stenosed.   Prox RCA lesion is 100% stenosed.   A drug-eluting stent was successfully placed using a STENT ONYX FRONTIER 2.75X22.   Post intervention, there is a 0% residual stenosis.    IMPRESSION: Successful PCI drug-eluting stenting of an occluded dominant proximal RCA using overlapping Medtronic Onyx drug-eluting stents.  The patient did have vomiting during and at the end of the case.  I was concerned that he may have vomited up his Brilinta and therefore bolused him with cangrelor and put him on a cangrelor drip.  We will reload him with p.o. Brilinta.  He will need uninterrupted DAPT for at least 12 months.  He in addition had a 70% distal left main which was short but no significant disease in the LAD and circumflex.  This will be treated medically at the current time.  2D echo will be ordered.  The patient was placed on high-dose atorvastatin, beta-blocker once his rhythm and heart rate have been documented to be stable.   Nanetta Batty. MD, [facility] 04/04/2021 6:27 AM        Past Medical History:  Diagnosis Date   Hypertension    MI (myocardial infarction) [facility])     Past Surgical History:  Procedure Laterality Date   CORONARY/GRAFT ACUTE MI REVASCULARIZATION N/A 04/04/2021   Procedure: Coronary/Graft Acute MI Revascularization;  Surgeon: Runell Gess, MD;  Location: [facility] INVASIVE CV LAB;  Service: Cardiovascular;  Laterality: N/A;   LEFT HEART CATH AND CORONARY ANGIOGRAPHY N/A 04/04/2021   Procedure: LEFT HEART CATH AND CORONARY ANGIOGRAPHY;  Surgeon: Runell Gess, MD;  Location: [facility] INVASIVE CV LAB;  Service: Cardiovascular;  Laterality: N/A;    Current Medications: Current Meds  Medication Sig   albuterol (VENTOLIN HFA) 108 (90 Base) MCG/ACT inhaler Inhale 2 puffs into the lungs every 4 (four) hours as needed.   aspirin EC 81 MG  tablet Take 1 tablet (81 mg total) by mouth daily.   cetirizine (ZYRTEC) 10 MG tablet Take 10 mg by mouth daily.   clopidogrel (PLAVIX) 75 MG tablet Take 300 mg 02/28/22 then 75 mg daily thereafter   cyanocobalamin (VITAMIN B12) 1000 MCG tablet Take 2 tablets by mouth daily.   ferrous sulfate 325 (65 FE) MG tablet Take 1 tablet (325 mg total) by mouth daily.   fluticasone-salmeterol (ADVAIR) 250-50 MCG/ACT AEPB Inhale 1 puff into the lungs in the morning and at bedtime.   furosemide (LASIX) 20 MG tablet Take 20 mg by mouth daily.   metoprolol succinate (TOPROL-XL) 25 MG 24 hr tablet TAKE (1) TABLET BY MOUTH DAILY.   Multiple Vitamin (MULTIVITAMIN WITH MINERALS) TABS tablet Take 1 tablet by mouth daily.   nitroGLYCERIN (NITROSTAT) 0.4 MG SL tablet Place 1 tablet (0.4 mg total) under the tongue every 5 (five) minutes x 3 doses as needed for chest pain.   potassium chloride  SA (KLOR-CON M) 20 MEQ tablet Take 1 daily for three days then as needed thereafter   atorvastatin (LIPITOR) 80 MG tablet Take 1 tablet (80 mg total) by mouth daily.   empagliflozin (JARDIANCE) 10 MG TABS tablet Take 1 tablet (10 mg total) by mouth daily.      Allergies:   Patient has no known allergies.   Social History   Socioeconomic History   Marital status: Married    Spouse name: Not on file   Number of children: Not on file   Years of education: Not on file   Highest education level: Not on file  Occupational History   Not on file  Tobacco Use   Smoking status: Former    Packs/day: 0.50    Years: 20.00    Total pack years: 10.00    Types: Cigarettes    Quit date: 04/05/2021    Years since quitting: 0.9   Smokeless tobacco: Never  Substance and Sexual Activity   Alcohol use: Not Currently   Drug use: Never   Sexual activity: Not on file  Other Topics Concern   Not on file  Social History Narrative   Not on file   Social Determinants of Health   Financial Resource Strain: Not on file  Food  Insecurity: Not on file  Transportation Needs: Not on file  Physical Activity: Not on file  Stress: Not on file  Social Connections: Not on file     Family History:  The patient's family history includes CAD in his father.  ROS:   Please see the history of present illness.  All other systems are reviewed and otherwise negative.    EKG(s)/Additional Labs   EKG:  EKG is not ordered today  Recent Labs: 04/05/2021: Magnesium 2.2 02/25/2022: ALT 25; NT-Pro BNP 76; TSH 1.690 02/26/2022: B Natriuretic Peptide 26.0; BUN 15; Creatinine, Ser 0.83; Hemoglobin 10.2; Platelets 320; Potassium 3.7; Sodium 138  Recent Lipid Panel    Component Value Date/Time   CHOL 128 08/01/2021 0759   TRIG 93 08/01/2021 0759   HDL 44 08/01/2021 0759   CHOLHDL 2.9 08/01/2021 0759   CHOLHDL 4.5 04/05/2021 0229   VLDL 17 04/05/2021 0229   LDLCALC 66 08/01/2021 0759    PHYSICAL EXAM:    VS:  BP 124/82   Pulse 88   Ht 5\' 6"  (1.676 m)   Wt 281 lb (127.5 kg)   SpO2 95%   BMI 45.35 kg/m   BMI: Body mass index is 45.35 kg/m.  GEN: Well nourished, well developed male in no acute distress HEENT: normocephalic, atraumatic Neck: no JVD, carotid bruits, or masses Cardiac: RRR; very soft SEM RUSB, no rubs or gallops, mild sockline edema, no longer pitting Respiratory:  clear to auscultation bilaterally, normal work of breathing GI: soft, nontender, nondistended, + BS MS: no deformity or atrophy Skin: warm and dry, no rash Neuro:  Alert and Oriented x 3, Strength and sensation are intact, follows commands Psych: euthymic mood, full affect  Wt Readings from Last 3 Encounters:  03/19/22 281 lb (127.5 kg)  02/25/22 282 lb (127.9 kg)  08/02/21 269 lb 3.2 oz (122.1 kg)     ASSESSMENT & PLAN:   1. Dyspnea on exertion - suspect multifactorial in setting of weight gain, progressive anemia, Brilinta rx, running out of Advair, and potential component of HFpEF. Symptoms improved with the changes made above.  He was previously recommended also for lung CA screening CT due to significant prior tobacco hx but did  not pursue due to cost. I inquired whether he wanted [place] to contact social work for options for assistance but he reports he has a few things rolling in the works to help with cost.   2. Microcytic anemia - strongly encouraged follow-up for this. He had not yet followed up with PCP or GI as instructed. We discussed that this finding can sometimes even reflect something like undiagnosed colon CA, especially since he is overdue for colonoscopy. If a clearance comes through for this, he is medically cleared from my perspective to proceed but would need input from MD to hold Plavix given overlapping stents. Will recheck CBC today along with anemia panel but he promises to f/u with his PCP.   3. CAD, HLD - Brilinta switched to Plavix per discussion with Dr. Gwenlyn Found after last OV. Continue this along with ASA. Refill atorvastatin as requested. Continue metoprolol. Will have him f/u with Dr. Gwenlyn Found in 3 months at which the ultimate duration of DAPT can be reviewed.  4. Suspected chronic HFpEF - weight has not changed much but edema looks improved and SOB improved. BNP was normal but can be falsely low in setting of obesity. However, CXR was recently clear. He's taking Lasix daily at this point which I would continue. He will check at home whether he is taking potassium and will call us with update. Will recheck BMET today. OK to refill Jardiance. Encouraged him to schedule echo when he is able. Also relayed HF dietary recs on AVS.  5. Essential HTN - borderline BP noted on arrival but recheck normal, 138/70->124/82. It was similarly normal during ED visit. Continue present regimen for now. With recent progressive anemia would avoid being too aggressive until this has been evaluated.    Disposition: F/u with Dr. Gwenlyn Found in 3 months.   Medication Adjustments/Labs and Tests Ordered: Current medicines are reviewed  at length with the patient today.  Concerns regarding medicines are outlined above. Medication changes, Labs and Tests ordered today are summarized above and listed in the Patient Instructions accessible in Encounters.   Signed, Charlie Pitter, PA-C  03/19/2022 9:43 AM    [place] Phone: [phone]; Fax: [phone]

## 2022-03-19 ENCOUNTER — Ambulatory Visit: Payer: BC Managed Care – PPO | Attending: Physician Assistant | Admitting: Physician Assistant

## 2022-03-19 ENCOUNTER — Encounter: Payer: Self-pay | Admitting: Physician Assistant

## 2022-03-19 ENCOUNTER — Telehealth: Payer: Self-pay | Admitting: Physician Assistant

## 2022-03-19 VITALS — BP 124/82 | HR 88 | Ht 66.0 in | Wt 281.0 lb

## 2022-03-19 DIAGNOSIS — I2510 Atherosclerotic heart disease of native coronary artery without angina pectoris: Secondary | ICD-10-CM

## 2022-03-19 DIAGNOSIS — R0609 Other forms of dyspnea: Secondary | ICD-10-CM | POA: Diagnosis not present

## 2022-03-19 DIAGNOSIS — D509 Iron deficiency anemia, unspecified: Secondary | ICD-10-CM

## 2022-03-19 DIAGNOSIS — E785 Hyperlipidemia, unspecified: Secondary | ICD-10-CM

## 2022-03-19 DIAGNOSIS — I10 Essential (primary) hypertension: Secondary | ICD-10-CM

## 2022-03-19 DIAGNOSIS — I5032 Chronic diastolic (congestive) heart failure: Secondary | ICD-10-CM

## 2022-03-19 MED ORDER — EMPAGLIFLOZIN 10 MG PO TABS: 10.0000 mg | ORAL_TABLET | Freq: Every day | ORAL | 3 refills | Status: DC

## 2022-03-19 MED ORDER — ATORVASTATIN CALCIUM 80 MG PO TABS: 80.0000 mg | ORAL_TABLET | Freq: Every day | ORAL | 3 refills | Status: DC

## 2022-03-19 NOTE — Patient Instructions (Addendum)
Medication Instructions:   Your physician recommends that you continue on your current medications as directed. Please refer to the Current Medication list given to you today.  *If you need a refill on your cardiac medications before your next appointment, please call your pharmacy*   Lab Work:  TODAY :  BMET CBC AND ANEMIA PANEL   If you have labs (blood work) drawn today and your tests are completely normal, you will receive your results only by: MyChart Message (if you have MyChart) OR A paper copy in the mail If you have any lab test that is abnormal or we need to change your treatment, we will call you to review the results.   Testing/Procedures: NONE ORDERED  TODAY    Follow-Up: At [facility], you and your health needs are our priority.  As part of our continuing mission to provide you with exceptional heart care, we have created designated Provider Care Teams.  These Care Teams include your primary Cardiologist (physician) and Advanced Practice Providers (APPs -  Physician Assistants and Nurse Practitioners) who all work together to provide you with the care you need, when you need it.  We recommend signing up for the patient portal called "MyChart".  Sign up information is provided on this After Visit Summary.  MyChart is used to connect with patients for Virtual Visits (Telemedicine).  Patients are able to view lab/test results, encounter notes, upcoming appointments, etc.  Non-urgent messages can be sent to your provider as well.   To learn more about what you can do with MyChart, go to [email or web address].    Your next appointment:   3 month(s)  The format for your next appointment:   In Person  Provider:   Nanetta Batty, MD     Other Instructions  Please check at home whether you are taking potassium or not - please call us with an update or send a MyChart message.  Please call our office when you are ready to schedule your echocardiogram.  It is  very important to follow up with primary care for your anemia. I would also strongly encouraging returning the GI team's call to schedule your visit.  For patients with history of swelling, we give them these special instructions:  1. Follow a low-salt diet - you should be eating no more than 2,000mg  of sodium per day. This does not necessarily just apply to the salt you put on top of prepared food, but the sodium already in food. Processed food, frozen meals, canned goods, deli meat, and bread can have a surprising amount of sodium per serving so be sure to track this daily. 2. Watch your fluid intake. In general, you should not be taking in more than 2 liters of fluid per day (close to 64 oz of fluid per day). This includes sources of water in foods like soup, coffee, tea, milk, etc. It's important to stay hydrated but NOT to excess. 2. Weigh yourself on the same scale at same time of day and keep a log. 3. Call your doctor: (Anytime you feel any of the following symptoms)  - 3lb weight gain overnight or 5lb within a few days - Shortness of breath, with or without a dry hacking cough  - Swelling in the hands, feet or stomach  - If you have to sleep on extra pillows at night in order to breathe   IT IS IMPORTANT TO LET YOUR DOCTOR KNOW EARLY ON IF YOU ARE HAVING SYMPTOMS SO WE CAN HELP  YOU!  Important Information About Sugar

## 2022-03-19 NOTE — Telephone Encounter (Signed)
Noted, await labs.

## 2022-03-19 NOTE — Telephone Encounter (Signed)
Called patient back per message received.   Pt wanted Ms. Dunn PA-C to know that he IS still taking 20 Meq of Potassium.    Pt told that I will forward his message to her, and to let us know if he has any new or worsening symptoms.  Pt understood.

## 2022-03-19 NOTE — Telephone Encounter (Signed)
Patient called to let Ronie Spies know that he is still taking the medication that was discussed at appt this morning

## 2022-03-20 ENCOUNTER — Telehealth: Payer: Self-pay

## 2022-03-20 LAB — CBC
Hematocrit: 41.8 % (ref 37.5–51.0)
Hemoglobin: 12.2 g/dL — ABNORMAL LOW (ref 13.0–17.7)
MCH: 21.1 pg — ABNORMAL LOW (ref 26.6–33.0)
MCHC: 29.2 g/dL — ABNORMAL LOW (ref 31.5–35.7)
MCV: 72 fL — ABNORMAL LOW (ref 79–97)
Platelets: 332 10*3/uL (ref 150–450)
RBC: 5.78 x10E6/uL (ref 4.14–5.80)
RDW: 24.2 % — ABNORMAL HIGH (ref 11.6–15.4)
WBC: 6.6 10*3/uL (ref 3.4–10.8)

## 2022-03-20 LAB — BASIC METABOLIC PANEL
BUN/Creatinine Ratio: 19 (ref 10–24)
BUN: 19 mg/dL (ref 8–27)
CO2: 24 mmol/L (ref 20–29)
Calcium: 9.4 mg/dL (ref 8.6–10.2)
Chloride: 106 mmol/L (ref 96–106)
Creatinine, Ser: 0.99 mg/dL (ref 0.76–1.27)
Glucose: 118 mg/dL — ABNORMAL HIGH (ref 70–99)
Potassium: 4.5 mmol/L (ref 3.5–5.2)
Sodium: 144 mmol/L (ref 134–144)
eGFR: 84 mL/min/{1.73_m2} (ref >59)

## 2022-03-20 LAB — FOLATE: Folate: >20 ng/mL (ref >3.0)

## 2022-03-20 LAB — IRON AND TIBC
Iron Saturation: 68 % — ABNORMAL HIGH (ref 15–55)
Iron: 236 ug/dL — ABNORMAL HIGH (ref 38–169)
Total Iron Binding Capacity: 349 ug/dL (ref 250–450)
UIBC: 113 ug/dL (ref 111–343)

## 2022-03-20 LAB — FERRITIN: Ferritin: 30 ng/mL (ref 30–400)

## 2022-03-20 LAB — VITAMIN B12: Vitamin B-12: 1044 pg/mL (ref 232–1245)

## 2022-03-20 NOTE — Telephone Encounter (Signed)
-----   Message from Laurann [place], [place] sent at 03/20/2022  2:26 PM EST ----- Please let pt know the iron supplement looks to be helping his blood count. Ferritin level is still on low end of normal and remains mildly anemic so would not make any changes to iron supplement at this time. He called [place] yesterday to verify he's still taking KCl daily since he has continued the Lasix. Therefore would continue Lasix 20mg  daily and KCl daily - please update rx on file to reflect this. (The prior Lasix/KCl prescriptions last month were PRN but he told yesterday he was taking daily). Still VERY important to f/u PCP/GI as we discussed to find out why he was so iron deficient. B12/folate still pending but will only call if abnormal. Otherwise continue f/u as discussed. Thank you!

## 2022-04-09 ENCOUNTER — Ambulatory Visit: Payer: BC Managed Care – PPO | Admitting: Gastroenterology

## 2022-05-26 ENCOUNTER — Telehealth: Payer: Self-pay | Admitting: Cardiovascular Disease

## 2022-05-26 NOTE — Telephone Encounter (Signed)
Ok to take Keflex, no issue with other meds.

## 2022-05-26 NOTE — Telephone Encounter (Signed)
Spoke with the patient and gave advisement per PharmD he is okay to take Keflex. Patient verbalized understanding.

## 2022-05-26 NOTE — Telephone Encounter (Signed)
Pt c/o medication issue:  1. Name of Medication: Keflex 500 mg   2. How are you currently taking this medication (dosage and times per day)? 3 times a day  3. Are you having a reaction (difficulty breathing--STAT)?   4. What is your medication issue? Patient wants to know if Amelia Jo is alright for him to take with all of his other medicine? He needs to know this asap, so he knows whether he needs to buy it please

## 2022-06-10 ENCOUNTER — Other Ambulatory Visit (HOSPITAL_COMMUNITY): Payer: Self-pay | Admitting: Nurse Practitioner

## 2022-06-10 ENCOUNTER — Ambulatory Visit (HOSPITAL_COMMUNITY)
Admission: RE | Admit: 2022-06-10 | Discharge: 2022-06-10 | Disposition: A | Discharge status: Home / Self Care | Payer: BC Managed Care – PPO | Source: Ambulatory Visit | Attending: Nurse Practitioner | Admitting: Nurse Practitioner

## 2022-06-10 DIAGNOSIS — M545 Low back pain, unspecified: Secondary | ICD-10-CM | POA: Diagnosis present

## 2022-07-29 ENCOUNTER — Ambulatory Visit: Payer: BC Managed Care – PPO | Attending: Cardiovascular Disease | Admitting: Cardiovascular Disease

## 2022-07-29 ENCOUNTER — Encounter: Payer: Self-pay | Admitting: Cardiovascular Disease

## 2022-07-29 VITALS — BP 132/78 | HR 92 | Ht 66.0 in | Wt 293.0 lb

## 2022-07-29 DIAGNOSIS — I2111 ST elevation (STEMI) myocardial infarction involving right coronary artery: Secondary | ICD-10-CM | POA: Insufficient documentation

## 2022-07-29 DIAGNOSIS — Z720 Tobacco use: Secondary | ICD-10-CM | POA: Diagnosis not present

## 2022-07-29 DIAGNOSIS — I10 Essential (primary) hypertension: Secondary | ICD-10-CM | POA: Diagnosis not present

## 2022-07-29 DIAGNOSIS — E785 Hyperlipidemia, unspecified: Secondary | ICD-10-CM | POA: Diagnosis not present

## 2022-07-29 NOTE — Assessment & Plan Note (Signed)
History of hyperlipidemia on high-dose statin therapy with lipid profile performed a year ago revealing a total cholesterol of 128, LDL 66 and HDL 44.

## 2022-07-29 NOTE — Assessment & Plan Note (Signed)
History of discontinue tobacco use at the time of his inferior STEMI

## 2022-07-29 NOTE — Progress Notes (Signed)
07/29/2022 Dakota Odom   [birth year]  [id number]  Primary Physician John Giovanni, MD Primary Cardiologist: Runell Gess MD Milagros Loll, [place], [place]  HPI:  Dakota Odom is a 67 y.o.   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  His primary care provider is Dr. Gareth Morgan in [facility].  I last saw him in the office 08/03/2018.  His cardiac risk factors are notable for over 100 pack years of tobacco abuse having chest stopped at the time of his myocardial infarction.  He has treated hypertension, hyperlipidemia and a strong family history for heart disease.  His father had CABG and his mother had myocardial infarction, his brother died as well.  He was awakened with chest pain on the evening of 04/04/2021 and drove himself to the [facility] ER where he was found to have inferior ST segment elevation.  He was transported urgently to [facility] where I performed urgent coronary angiography via the right radial approach.  He had moderate distal left main disease, diagonal branch ostial disease and an occluded proximal dominant RCA which I stented using 3 overlapping Medtronic frontier drug-eluting stents.  I postdilated the proximal portion with a 4 mm balloon.  His echo the following day revealed normal LV systolic function and he was discharged home on the 24th on dual antiplatelet therapy.  He is been asymptomatic since.  He did stop smoking at the time of his myocardial infarction.  Since I saw him a year ago he continues to do well.  He has unfortunately gained 25 pounds as I last saw him.  He was transition from Brilinta to clopidogrel.  He denies chest pain or shortness of breath.   Current Meds  Medication Sig   albuterol (VENTOLIN HFA) 108 (90 Base) MCG/ACT inhaler Inhale 2 puffs into the lungs every 4 (four) hours as needed.   aspirin EC 81 MG tablet Take 1 tablet (81 mg total) by  mouth daily.   atorvastatin (LIPITOR) 80 MG tablet Take 1 tablet (80 mg total) by mouth daily.   cetirizine (ZYRTEC) 10 MG tablet Take 10 mg by mouth daily.   clopidogrel (PLAVIX) 75 MG tablet Take 300 mg 02/28/22 then 75 mg daily thereafter   cyanocobalamin (VITAMIN B12) 1000 MCG tablet Take 2 tablets by mouth daily.   empagliflozin (JARDIANCE) 10 MG TABS tablet Take 1 tablet (10 mg total) by mouth daily.   ferrous sulfate 325 (65 FE) MG tablet Take 1 tablet (325 mg total) by mouth daily.   fluticasone-salmeterol (ADVAIR) 250-50 MCG/ACT AEPB Inhale 1 puff into the lungs in the morning and at bedtime.   furosemide (LASIX) 20 MG tablet Take 20 mg by mouth daily.   metFORMIN (GLUCOPHAGE-XR) 500 MG 24 hr tablet Take 500 mg by mouth daily.   metoprolol succinate (TOPROL-XL) 25 MG 24 hr tablet TAKE (1) TABLET BY MOUTH DAILY.   Multiple Vitamin (MULTIVITAMIN WITH MINERALS) TABS tablet Take 1 tablet by mouth daily.   nitroGLYCERIN (NITROSTAT) 0.4 MG SL tablet Place 1 tablet (0.4 mg total) under the tongue every 5 (five) minutes x 3 doses as needed for chest pain.   potassium chloride SA (KLOR-CON M) 20 MEQ tablet Take 1 daily for three days then as needed thereafter (Patient taking differently: 20 mEq daily. Take 1 daily for three days then as needed thereafter)     No Known Allergies  Social History  Socioeconomic History   Marital status: Married    Spouse name: Not on file   Number of children: Not on file   Years of education: Not on file   Highest education level: Not on file  Occupational History   Not on file  Tobacco Use   Smoking status: Former    Packs/day: 0.50    Years: 20.00    Additional pack years: 0.00    Total pack years: 10.00    Types: Cigarettes    Quit date: 04/05/2021    Years since quitting: 1.3   Smokeless tobacco: Never  Substance and Sexual Activity   Alcohol use: Not Currently   Drug use: Never   Sexual activity: Not on file  Other Topics Concern   Not  on file  Social History Narrative   Not on file   Social Determinants of Health   Financial Resource Strain: Not on file  Food Insecurity: Not on file  Transportation Needs: Not on file  Physical Activity: Not on file  Stress: Not on file  Social Connections: Not on file  Intimate Partner Violence: Not on file     Review of Systems: General: negative for chills, fever, night sweats or weight changes.  Cardiovascular: negative for chest pain, dyspnea on exertion, edema, orthopnea, palpitations, paroxysmal nocturnal dyspnea or shortness of breath Dermatological: negative for rash Respiratory: negative for cough or wheezing Urologic: negative for hematuria Abdominal: negative for nausea, vomiting, diarrhea, bright red blood per rectum, melena, or hematemesis Neurologic: negative for visual changes, syncope, or dizziness All other systems reviewed and are otherwise negative except as noted above.    Blood pressure 132/78, pulse 92, height  (1.676 m), weight 293 lb (132.9 kg), SpO2 96 %.  General appearance: alert and no distress Neck: no adenopathy, no carotid bruit, no JVD, supple, symmetrical, trachea midline, and thyroid not enlarged, symmetric, no tenderness/mass/nodules Lungs: clear to auscultation bilaterally Heart: regular rate and rhythm, S1, S2 normal, no murmur, click, rub or gallop Extremities: extremities normal, atraumatic, no cyanosis or edema Pulses: 2+ and symmetric Skin: Skin color, texture, turgor normal. No rashes or lesions Neurologic: Grossly normal  EKG not performed today  ASSESSMENT AND PLAN:   STEMI involving right coronary artery (HCC) History of inferior STEMI 04/04/2021 treated with PCI and stenting of the RCA using 3 overlapping Medtronic frontier drug-eluting stents by myself through the right radial approach.  He had normal LV function.  He was on aspirin and Brilinta was switched which was transitioned to clopidogrel because of GI bleeding.   He denies chest pain or shortness of breath.  Hyperlipidemia LDL goal <70 History of hyperlipidemia on high-dose statin therapy with lipid profile performed a year ago revealing a total cholesterol of 128, LDL 66 and HDL 44.  Hypertension History of essential hypertension a blood pressure measured at 132/78.  He is on metoprolol.  Tobacco abuse History of discontinue tobacco use at the time of his inferior STEMI  Morbid obesity (HCC) History of morbid obesity with a BMI of 47.  He is gained 25 pounds since I saw him a year ago.  He really does not exercise.  We talked about GLP-1 agonists versus referral to diet wellness center.     Runell Gess MD FACP,FACC,FAHA, [place] 07/29/2022 10:37 AM

## 2022-07-29 NOTE — Patient Instructions (Signed)
Medication Instructions:  Your physician recommends that you continue on your current medications as directed. Please refer to the Current Medication list given to you today.  *If you need a refill on your cardiac medications before your next appointment, please call your pharmacy*   Follow-Up: At [facility], you and your health needs are our priority.  As part of our continuing mission to provide you with exceptional heart care, we have created designated Provider Care Teams.  These Care Teams include your primary Cardiologist (physician) and Advanced Practice Providers (APPs -  Physician Assistants and Nurse Practitioners) who all work together to provide you with the care you need, when you need it.  We recommend signing up for the patient portal called "MyChart".  Sign up information is provided on this After Visit Summary.  MyChart is used to connect with patients for Virtual Visits (Telemedicine).  Patients are able to view lab/test results, encounter notes, upcoming appointments, etc.  Non-urgent messages can be sent to your provider as well.   To learn more about what you can do with MyChart, go to [email or web address].    Your next appointment:   12 month(s)  Provider:   Jonathan Berry, MD    

## 2022-07-29 NOTE — Assessment & Plan Note (Signed)
History of inferior STEMI 04/04/2021 treated with PCI and stenting of the RCA using 3 overlapping Medtronic frontier drug-eluting stents by myself through the right radial approach.  He had normal LV function.  He was on aspirin and Brilinta was switched which was transitioned to clopidogrel because of GI bleeding.  He denies chest pain or shortness of breath.

## 2022-07-29 NOTE — Assessment & Plan Note (Signed)
History of essential hypertension a blood pressure measured at 132/78.  He is on metoprolol.

## 2022-07-29 NOTE — Assessment & Plan Note (Signed)
History of morbid obesity with a BMI of 47.  He is gained 25 pounds since I saw him a year ago.  He really does not exercise.  We talked about GLP-1 agonists versus referral to diet wellness center.

## 2022-08-07 ENCOUNTER — Telehealth: Payer: Self-pay | Admitting: Cardiovascular Disease

## 2022-08-07 MED ORDER — NITROGLYCERIN 0.4 MG SL SUBL: 0.4000 mg | SUBLINGUAL_TABLET | SUBLINGUAL | 3 refills | Status: DC | PRN

## 2022-08-07 NOTE — Telephone Encounter (Signed)
Refill for Nitroglycerin has been sent to [facility].

## 2022-08-07 NOTE — Telephone Encounter (Signed)
*  STAT* If patient is at the pharmacy, call can be transferred to refill team.   1. Which medications need to be refilled? (please list name of each medication and dose if known)   nitroGLYCERIN (NITROSTAT) 0.4 MG SL tablet   2. Which pharmacy/location (including street and city if local pharmacy) is medication to be sent to? [facility] - , [place] - [address]   3. Do they need a 30 day or 90 day supply?  30 day  

## 2022-08-20 ENCOUNTER — Encounter: Payer: Self-pay | Admitting: Orthopaedic Surgery

## 2022-08-20 ENCOUNTER — Ambulatory Visit (INDEPENDENT_AMBULATORY_CARE_PROVIDER_SITE_OTHER): Payer: BC Managed Care – PPO | Admitting: Orthopaedic Surgery

## 2022-08-20 VITALS — BP 147/77 | HR 88 | Ht 66.0 in | Wt 295.0 lb

## 2022-08-20 DIAGNOSIS — S83221A Peripheral tear of medial meniscus, current injury, right knee, initial encounter: Secondary | ICD-10-CM | POA: Diagnosis not present

## 2022-08-20 NOTE — Patient Instructions (Signed)
Get CD of your MRI so the surgeon can review it at the surgical consult visit

## 2022-08-20 NOTE — Progress Notes (Signed)
Subjective:    Patient ID: Dakota Odom, male    DOB: Sep 07, 1955, 67 y.o.   MRN: [id number]  HPI He has been having pain in the right knee over the last several months that is getting gradually worse.  He was seen at [facility] and then by Dr. Sudie Bailey.  MRI was done at [facility] showing oblique tear of th posterior horn of the medial meniscus and radial tear of the body also.  He has small horizontal tear of the free edge of the posterior horn of the lateral meniscus and tricompartmental degenerative changes.  He has no giving way, no redness but has swelling.  It bothers him most on stairs.  He has some pain at night.  He cannot remember any trauma.     Review of Systems  Constitutional:  Positive for activity change.  Musculoskeletal:  Positive for arthralgias, gait problem and joint swelling.  All other systems reviewed and are negative. For Review of Systems, all other systems reviewed and are negative.  The following is a summary of the past history medically, past history surgically, known current medicines, social history and family history.  This information is gathered electronically by the computer from prior information and documentation.  I review this each visit and have found including this information at this point in the chart is beneficial and informative.   Past Medical History:  Diagnosis Date   Anemia    new in 2023   CAD (coronary artery disease)    Chronic heart failure with preserved ejection fraction (HFpEF) (HCC)    Hypertension    MI (myocardial infarction) (HCC)    Morbid obesity (HCC)    Pre-diabetes     Past Surgical History:  Procedure Laterality Date   CORONARY/GRAFT ACUTE MI REVASCULARIZATION N/A 04/04/2021   Procedure: Coronary/Graft Acute MI Revascularization;  Surgeon: Runell Gess, MD;  Location: [facility] INVASIVE CV [facility];  Service: Cardiovascular;  Laterality: N/A;   LEFT HEART CATH AND CORONARY ANGIOGRAPHY  N/A 04/04/2021   Procedure: LEFT HEART CATH AND CORONARY ANGIOGRAPHY;  Surgeon: Runell Gess, MD;  Location: [facility] INVASIVE CV LAB;  Service: Cardiovascular;  Laterality: N/A;    Current Outpatient Medications on File Prior to Visit  Medication Sig Dispense Refill   albuterol (VENTOLIN HFA) 108 (90 Base) MCG/ACT inhaler Inhale 2 puffs into the lungs every 4 (four) hours as needed.     aspirin EC 81 MG tablet Take 1 tablet (81 mg total) by mouth daily.     atorvastatin (LIPITOR) 80 MG tablet Take 1 tablet (80 mg total) by mouth daily. 90 tablet 3   cetirizine (ZYRTEC) 10 MG tablet Take 10 mg by mouth daily.     clopidogrel (PLAVIX) 75 MG tablet Take 300 mg 02/28/22 then 75 mg daily thereafter 90 tablet 3   cyanocobalamin (VITAMIN B12) 1000 MCG tablet Take 2 tablets by mouth daily.     empagliflozin (JARDIANCE) 10 MG TABS tablet Take 1 tablet (10 mg total) by mouth daily. 90 tablet 3   ferrous sulfate 325 (65 FE) MG tablet Take 1 tablet (325 mg total) by mouth daily. 30 tablet 0   fluticasone-salmeterol (ADVAIR) 250-50 MCG/ACT AEPB Inhale 1 puff into the lungs in the morning and at bedtime.     furosemide (LASIX) 20 MG tablet Take 20 mg by mouth daily.     metFORMIN (GLUCOPHAGE-XR) 500 MG 24 hr tablet Take 500 mg by mouth daily.     metoprolol succinate (  TOPROL-XL) 25 MG 24 hr tablet TAKE (1) TABLET BY MOUTH DAILY. 90 tablet 3   Multiple Vitamin (MULTIVITAMIN WITH MINERALS) TABS tablet Take 1 tablet by mouth daily.     nitroGLYCERIN (NITROSTAT) 0.4 MG SL tablet Place 1 tablet (0.4 mg total) under the tongue every 5 (five) minutes x 3 doses as needed for chest pain. 25 tablet 3   potassium chloride SA (KLOR-CON M) 20 MEQ tablet Take 1 daily for three days then as needed thereafter (Patient taking differently: 20 mEq daily. Take 1 daily for three days then as needed thereafter) 30 tablet 3   No current facility-administered medications on file prior to visit.    Social History    Socioeconomic History   Marital status: Married    Spouse name: Not on file   Number of children: Not on file   Years of education: Not on file   Highest education level: Not on file  Occupational History   Not on file  Tobacco Use   Smoking status: Former    Packs/day: 0.50    Years: 20.00    Additional pack years: 0.00    Total pack years: 10.00    Types: Cigarettes    Quit date: 04/05/2021    Years since quitting: 1.3   Smokeless tobacco: Never  Substance and Sexual Activity   Alcohol use: Not Currently   Drug use: Never   Sexual activity: Not on file  Other Topics Concern   Not on file  Social History Narrative   Not on file   Social Determinants of Health   Financial Resource Strain: Not on file  Food Insecurity: Not on file  Transportation Needs: Not on file  Physical Activity: Not on file  Stress: Not on file  Social Connections: Not on file  Intimate Partner Violence: Not on file    Family History  Problem Relation Age of Onset   CAD Father     BP (!) 147/77   Pulse 88   Ht 5\' 6"  (1.676 m)   Wt 295 lb (133.8 kg)   BMI 47.61 kg/m   Body mass index is 47.61 kg/m.      Objective:   Physical Exam Vitals and nursing note reviewed. Exam conducted with a chaperone present.  Constitutional:      Appearance: He is well-developed.  HENT:     Head: Normocephalic and atraumatic.  Eyes:     Conjunctiva/sclera: Conjunctivae normal.     Pupils: Pupils are equal, round, and reactive to light.  Cardiovascular:     Rate and Rhythm: Normal rate and regular rhythm.  Pulmonary:     Effort: Pulmonary effort is normal.  Abdominal:     Palpations: Abdomen is soft.  Musculoskeletal:     Cervical back: Normal range of motion and neck supple.       Legs:  Skin:    General: Skin is warm and dry.  Neurological:     Mental Status: He is alert and oriented to person, place, and time.     Cranial Nerves: No cranial nerve deficit.     Motor: No abnormal  muscle tone.     Coordination: Coordination normal.     Deep Tendon Reflexes: Reflexes are normal and symmetric. Reflexes normal.  Psychiatric:        Behavior: Behavior normal.        Thought Content: Thought content normal.        Judgment: Judgment normal.   I have reviewed the MRI  report.  The disc is not available.        Assessment & Plan:   Encounter Diagnosis  Name Primary?   Peripheral tear of medial meniscus of right knee as current injury, initial encounter Yes   I have explained the MRI to him.  I have recommended arthroscopy of the knee.  I will have him see Dr. Dallas Schimke or Dr. Romeo Apple.  Patient is agreeable to this.  Call if any problem.  Precautions discussed.  Electronically Signed Darreld Mclean, MD 5/8/202410:14 AM

## 2022-08-21 ENCOUNTER — Ambulatory Visit: Payer: BC Managed Care – PPO | Admitting: Orthopaedic Surgery

## 2022-08-27 NOTE — Progress Notes (Signed)
CLINICAL DATA: Right medial knee pain for 2 months  EXAM: MRI OF THE RIGHT KNEE WITHOUT CONTRAST  TECHNIQUE: Multiplanar, multisequence MR imaging of the knee was performed. No intravenous contrast was administered.  COMPARISON: None Available.  FINDINGS: MENISCI  Medial: Oblique tear of the posterior horn of the medial meniscus extending to the inferior articular surface. Radial tear of the body of the medial meniscus.  Lateral: Small horizontal tear of the free edge of the body of the lateral meniscus. Small radial tear of the free edge of the posterior horn of the lateral meniscus.  LIGAMENTS  Cruciates: ACL and PCL are intact.  Collaterals: Medial collateral ligament is intact. Lateral collateral ligament complex is intact.  CARTILAGE  Patellofemoral: Mild partial-thickness cartilage loss of the lateral patellofemoral compartment.  Medial: High-grade partial-thickness cartilage loss of the medial femorotibial compartment.  Lateral: Cartilage irregularity of the lateral femorotibial compartment without a focal chondral defect.  JOINT: No joint effusion. Normal Hoffa's fat-pad. No plical thickening.  POPLITEAL FOSSA: Popliteus tendon is intact. No Baker's cyst.  EXTENSOR MECHANISM: Intact quadriceps tendon. Intact patellar tendon. Intact lateral patellar retinaculum. Intact medial patellar retinaculum. Intact MPFL.  BONES: No aggressive osseous lesion. No fracture or dislocation.  Other: No fluid collection or hematoma. Muscles are normal.  IMPRESSION: 1. Oblique tear of the posterior horn of the medial meniscus extending to the inferior articular surface. Radial tear of the body of the medial meniscus. 2. Small horizontal tear of the free edge of the body of the lateral meniscus. Small radial tear of the free edge of the posterior horn of the lateral meniscus. 3. Tricompartmental cartilage abnormalities as described above.   Electronically Signed By:  Elige Ko M.D. On: 08/08/2022 05:59

## 2022-08-28 ENCOUNTER — Other Ambulatory Visit (INDEPENDENT_AMBULATORY_CARE_PROVIDER_SITE_OTHER): Payer: BC Managed Care – PPO

## 2022-08-28 ENCOUNTER — Telehealth: Payer: Self-pay | Admitting: Radiology

## 2022-08-28 ENCOUNTER — Ambulatory Visit (INDEPENDENT_AMBULATORY_CARE_PROVIDER_SITE_OTHER): Payer: BC Managed Care – PPO | Admitting: Orthopedic Surgery

## 2022-08-28 ENCOUNTER — Encounter: Payer: Self-pay | Admitting: Orthopedic Surgery

## 2022-08-28 VITALS — BP 145/78 | HR 88 | Ht 66.0 in | Wt 295.0 lb

## 2022-08-28 DIAGNOSIS — Z6841 Body Mass Index (BMI) 40.0 and over, adult: Secondary | ICD-10-CM

## 2022-08-28 DIAGNOSIS — S83221A Peripheral tear of medial meniscus, current injury, right knee, initial encounter: Secondary | ICD-10-CM

## 2022-08-28 DIAGNOSIS — G8929 Other chronic pain: Secondary | ICD-10-CM | POA: Diagnosis not present

## 2022-08-28 DIAGNOSIS — M23300 Other meniscus derangements, unspecified lateral meniscus, right knee: Secondary | ICD-10-CM

## 2022-08-28 DIAGNOSIS — M25561 Pain in right knee: Secondary | ICD-10-CM | POA: Diagnosis not present

## 2022-08-28 DIAGNOSIS — M1711 Unilateral primary osteoarthritis, right knee: Secondary | ICD-10-CM

## 2022-08-28 NOTE — Progress Notes (Signed)
Office Visit Note   Patient: Dakota Odom           Date of Birth: [birth year]           MRN: [id number] Visit Date: 08/28/2022 Requested by: John Giovanni, MD [address] [place],  [place] [address] PCP: John Giovanni, MD   Assessment & Plan:   67 year old male with coronary artery disease has meniscal tears medial and lateral with arthritis of his knees on Plavix.  He is a diabetic.  He has some risk factors for arthroscopic surgery and is also got a BMI of 47  I discussed this with him and he is relatively stable in terms of his symptoms right now and cardiology and medical consults will be needed prior to surgery.  He says he can live with the pain as it is now in regard to hold off on arthroscopic surgery of his right knee which would involve medial and lateral meniscectomies  No orders of the defined types were placed in this encounter.    Subjective: Chief Complaint  Patient presents with   Knee Pain    Right knee pain     HPI: 67 year old male woke up 1 morning with pain in his knee had trouble weightbearing went to urgent care eventually saw his primary care doctor MRI was ordered MRI shows medial lateral meniscal tears with osteoarthritis and he presents for possible evaluation for arthroscopic surgery              ROS: Shortness of breath  Without chest pain   Images personally read and my interpretation : His MRI shows a medial meniscal tear probably a lateral meniscal tear at least the free edges are torn he also has arthritis of the joint  I did internal imaging of his knee he has grade 2 arthritis of the knee  Visit Diagnoses:  1. Peripheral tear of medial meniscus of right knee as current injury, initial encounter   2. Primary osteoarthritis of right knee   3. 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 Body mass index 45.0-49.9, adult (HCC)   5. Morbid obesity (HCC)      Follow-Up Instructions: Return if symptoms worsen or fail  to improve.    Objective: Vital Signs: BP (!) 145/78   Pulse 88   Ht 5\' 6"  (1.676 m)   Wt 295 lb (133.8 kg)   BMI 47.61 kg/m   Physical Exam Vitals and nursing note reviewed.  Constitutional:      Appearance: Normal appearance.  HENT:     Head: Normocephalic and atraumatic.  Eyes:     General: No scleral icterus.       Right eye: No discharge.        Left eye: No discharge.     Extraocular Movements: Extraocular movements intact.     Conjunctiva/sclera: Conjunctivae normal.     Pupils: Pupils are equal, round, and reactive to light.  Cardiovascular:     Rate and Rhythm: Normal rate.     Pulses: Normal pulses.  Musculoskeletal:     Right knee: Effusion present.     Instability Tests: Medial McMurray test positive. Lateral McMurray test negative.     Left knee: No effusion.     Instability Tests: Medial McMurray test negative and lateral McMurray test negative.  Skin:    General: Skin is warm and dry.     Capillary Refill: Capillary refill takes less than 2 seconds.  Neurological:  General: No focal deficit present.     Mental Status: He is alert and oriented to person, place, and time.  Psychiatric:        Mood and Affect: Mood normal.        Behavior: Behavior normal.        Thought Content: Thought content normal.        Judgment: Judgment normal.      Right Knee Exam   Muscle Strength  The patient has normal right knee strength.  Tenderness  The patient is experiencing tenderness in the medial joint line.  Range of Motion  Extension:  5 normal  Flexion:  120 normal   Tests  McMurray:  Medial - positive Lateral - negative Varus: negative Valgus: negative Drawer:  Anterior - negative    Posterior - negative  Other  Erythema: absent Scars: absent Sensation: normal Pulse: present Swelling: none Effusion: effusion present   Left Knee Exam   Muscle Strength  The patient has normal left knee strength.  Tenderness  The patient is  experiencing no tenderness.   Range of Motion  Extension:  normal  Flexion:  normal   Tests  McMurray:  Medial - negative Lateral - negative Varus: negative Valgus: negative Drawer:  Anterior - negative     Posterior - negative  Other  Erythema: absent Scars: absent Sensation: normal Pulse: present Swelling: none Effusion: no effusion present      Specialty Comments:  No specialty comments available.  Imaging: DG Knee AP/LAT W/Sunrise Right  Result Date: 08/28/2022 Imaging report Pain right knee X-rays show definitive narrowing of the medial compartment mild narrowing of the lateral compartment Peaking of the tibial spines I do not see any definitive osteophytes except maybe the medial side of the patella Patella Bony anatomy is normal no tilt no subluxation Impression arthritis of the right knee grade 2     PMFS History: Patient Active Problem List   Diagnosis Date Noted   Bilateral carpal tunnel syndrome 12/10/2021   Morbid obesity (HCC) 04/24/2021   Hyperlipidemia LDL goal <70 04/06/2021   Prediabetes 04/06/2021   Hypertension 04/06/2021   Tobacco abuse 04/06/2021   STEMI involving right coronary artery (HCC) 04/04/2021   Past Medical History:  Diagnosis Date   Anemia    new in 2023   CAD (coronary artery disease)    Chronic heart failure with preserved ejection fraction (HFpEF) (HCC)    Hypertension    MI (myocardial infarction) (HCC)    Morbid obesity (HCC)    Pre-diabetes     Family History  Problem Relation Age of Onset   CAD Father     Past Surgical History:  Procedure Laterality Date   CORONARY/GRAFT ACUTE MI REVASCULARIZATION N/A 04/04/2021   Procedure: Coronary/Graft Acute MI Revascularization;  Surgeon: Runell Gess, MD;  Location: [facility];  Service: Cardiovascular;  Laterality: N/A;   LEFT HEART CATH AND CORONARY ANGIOGRAPHY N/A 04/04/2021   Procedure: LEFT HEART CATH AND CORONARY ANGIOGRAPHY;  Surgeon: Runell Gess, MD;   Location: [facility];  Service: Cardiovascular;  Laterality: N/A;   Social History   Occupational History   Not on file  Tobacco Use   Smoking status: Former    Packs/day: 0.50    Years: 20.00    Additional pack years: 0.00    Total pack years: 10.00    Types: Cigarettes    Quit date: 04/05/2021    Years since quitting: 1.3   Smokeless tobacco: Never  Substance and Sexual Activity   Alcohol use: Not Currently   Drug use: Never   Sexual activity: Not on file

## 2022-08-28 NOTE — Telephone Encounter (Signed)
I have advised Dr Romeo Apple We will be on the lookout for the cardiac PET and clearance soon Can proceed after that

## 2022-08-28 NOTE — Telephone Encounter (Signed)
-----   Message from Runell Gess, MD sent at 08/28/2022  3:31 PM EDT ----- Pt needs a cardiac PET study given his mod LM at time of STEMI 12/22 to risk stratify prior to ortho surgery ----- Message ----- From: Caffie Damme, RT Sent: 08/28/2022   9:55 AM EDT To: Runell Gess, MD

## 2022-08-31 ENCOUNTER — Emergency Department (HOSPITAL_COMMUNITY)
Admission: EM | Admit: 2022-08-31 | Discharge: 2022-08-31 | Disposition: A | Discharge status: Home / Self Care | Payer: BC Managed Care – PPO | Attending: Emergency Medicine | Admitting: Emergency Medicine

## 2022-08-31 ENCOUNTER — Encounter (HOSPITAL_COMMUNITY): Payer: Self-pay

## 2022-08-31 ENCOUNTER — Other Ambulatory Visit: Payer: Self-pay

## 2022-08-31 DIAGNOSIS — Z7982 Long term (current) use of aspirin: Secondary | ICD-10-CM | POA: Diagnosis not present

## 2022-08-31 DIAGNOSIS — R040 Epistaxis: Secondary | ICD-10-CM | POA: Diagnosis present

## 2022-08-31 DIAGNOSIS — Z7901 Long term (current) use of anticoagulants: Secondary | ICD-10-CM | POA: Diagnosis not present

## 2022-08-31 MED ORDER — OXYMETAZOLINE HCL 0.05 % NA SOLN
1.0000 | Freq: Once | NASAL | Status: AC
  Administered 2022-08-31: 1 via NASAL
  Filled 2022-08-31: qty 30

## 2022-08-31 NOTE — Discharge Instructions (Signed)
Please use the Afrin nasal spray on the affected side, you can use this twice a day as needed.  I have offered you the balloon to use to help stop the bleeding but you have declined, you may return at any time if your bleeding gets worse or if you change your mind.  Please sleep with a humidifier, you may apply a topical antibiotic ointment or Vaseline on the inside of the nose to help moisten up the tissue to prevent more bleeding  Thank you for allowing [place] to treat you in the emergency department today.  After reviewing your examination and potential testing that was done it appears that you are safe to go home.  I would like for you to follow-up with your doctor within the next several days, have them obtain your results and follow-up with them to review all of these tests.  If you should develop severe or worsening symptoms return to the emergency department immediately

## 2022-08-31 NOTE — ED Provider Notes (Signed)
[facility] EMERGENCY DEPARTMENT AT [facility] Provider Note   CSN: [id number] Arrival date & time: 08/31/22  2118     History  Chief Complaint  Patient presents with   Epistaxis    Dakota Odom is a 67 y.o. male.   Epistaxis  This patient is a 67 year old male presenting with a nosebleed, he is currently on Plavix after having cardiac obstructive disease.  He states it was on the right side it occurred just prior to arrival lasted about 10 minutes and then stopped spontaneously.  He has no other symptoms, he does not have a headache, his blood pressure is 142/72, there is no trauma, no picking, denies sneezing    Home Medications Prior to Admission medications   Medication Sig Start Date End Date Taking? Authorizing Provider  albuterol (VENTOLIN HFA) 108 (90 Base) MCG/ACT inhaler Inhale 2 puffs into the lungs every 4 (four) hours as needed.    [provider]  aspirin EC 81 MG tablet Take 1 tablet (81 mg total) by mouth daily. 04/06/21   Azalee Course, PA  atorvastatin (LIPITOR) 80 MG tablet Take 1 tablet (80 mg total) by mouth daily. 03/19/22   Dunn, Tacey Ruiz, PA-C  cetirizine (ZYRTEC) 10 MG tablet Take 10 mg by mouth daily.    [provider]  clopidogrel (PLAVIX) 75 MG tablet Take 300 mg 02/28/22 then 75 mg daily thereafter 02/27/22   Laurann Montana, PA-C  cyanocobalamin (VITAMIN B12) 1000 MCG tablet Take 2 tablets by mouth daily.    [provider]  empagliflozin (JARDIANCE) 10 MG TABS tablet Take 1 tablet (10 mg total) by mouth daily. 03/19/22   Dunn, Tacey Ruiz, PA-C  ferrous sulfate 325 (65 FE) MG tablet Take 1 tablet (325 mg total) by mouth daily. 02/26/22   Sloan Leiter, DO  fluticasone-salmeterol (ADVAIR) 250-50 MCG/ACT AEPB Inhale 1 puff into the lungs in the morning and at bedtime.    [provider]  furosemide (LASIX) 20 MG tablet Take 20 mg by mouth daily.    [provider]  metFORMIN (GLUCOPHAGE-XR) 500 MG 24 hr  tablet Take 500 mg by mouth daily. 07/23/22   [provider]  metoprolol succinate (TOPROL-XL) 25 MG 24 hr tablet TAKE (1) TABLET BY MOUTH DAILY. 02/17/22   Runell Gess, MD  Multiple Vitamin (MULTIVITAMIN WITH MINERALS) TABS tablet Take 1 tablet by mouth daily.    [provider]  nitroGLYCERIN (NITROSTAT) 0.4 MG SL tablet Place 1 tablet (0.4 mg total) under the tongue every 5 (five) minutes x 3 doses as needed for chest pain. 08/07/22   Runell Gess, MD  potassium chloride SA (KLOR-CON M) 20 MEQ tablet Take 1 daily for three days then as needed thereafter Patient taking differently: 20 mEq daily. Take 1 daily for three days then as needed thereafter 02/27/22   Laurann Montana, PA-C      Allergies    Patient has no known allergies.    Review of Systems   Review of Systems  HENT:  Positive for nosebleeds.   All other systems reviewed and are negative.   Physical Exam Updated Vital Signs BP (!) 142/72 (BP Location: Right Arm)   Pulse 86   Temp 98.2 F (36.8 C) (Oral)   Resp 18   Ht 1.676 m (5\' 6" )   Wt 133.8 kg   SpO2 94%   BMI 47.61 kg/m  Physical Exam Vitals and nursing note reviewed.  Constitutional:  General: He is not in acute distress.    Appearance: He is well-developed.  HENT:     Head: Normocephalic and atraumatic.     Nose:     Comments: Nasal passages are both clear of any wet or dried blood, there is no crusting, no sources of bleeding, very clear turbinates, septum and the base of the nasal passages.  There is no blood in the posterior oropharynx either    Mouth/Throat:     Pharynx: No oropharyngeal exudate.  Eyes:     General: No scleral icterus.       Right eye: No discharge.        Left eye: No discharge.     Conjunctiva/sclera: Conjunctivae normal.     Pupils: Pupils are equal, round, and reactive to light.  Neck:     Thyroid: No thyromegaly.     Vascular: No JVD.  Cardiovascular:     Rate and Rhythm: Normal rate and  regular rhythm.     Heart sounds: Normal heart sounds. No murmur heard.    No friction rub. No gallop.  Pulmonary:     Effort: Pulmonary effort is normal. No respiratory distress.     Breath sounds: Normal breath sounds. No wheezing or rales.  Abdominal:     General: Bowel sounds are normal. There is no distension.     Palpations: Abdomen is soft. There is no mass.     Tenderness: There is no abdominal tenderness.  Musculoskeletal:        General: No tenderness. Normal range of motion.     Cervical back: Normal range of motion and neck supple.  Lymphadenopathy:     Cervical: No cervical adenopathy.  Skin:    General: Skin is warm and dry.     Findings: No erythema or rash.  Neurological:     Mental Status: He is alert.     Coordination: Coordination normal.  Psychiatric:        Behavior: Behavior normal.     ED Results / Procedures / Treatments   Labs (all labs ordered are listed, but only abnormal results are displayed) Labs Reviewed - No data to display  EKG None  Radiology No results found.  Procedures Procedures    Medications Ordered in ED Medications  oxymetazoline (AFRIN) 0.05 % nasal spray 1 spray (1 spray Each Nare Given 08/31/22 2201)    ED Course/ Medical Decision Making/ A&P                             Medical Decision Making Risk OTC drugs.   Patient is in no distress, no bleeding, vitals unremarkable, he has not lost a significant amount of blood clinically by history, will observe for an hour to make sure he does not rebleed, Afrin ordered.  The patient was reevaluated a couple of times, there was a small spot of bleeding that occurred after I left the room.  It stopped again spontaneously after the Afrin.  The patient was offered a balloon epistaxis treatment, he declines at this time and states that he would like to go home and try the Afrin instead.  He is not actively bleeding, he is hemodynamically stable, he agrees to come back if things  get worse.        Final Clinical Impression(s) / ED Diagnoses Final diagnoses:  Epistaxis    Rx / DC Orders ED Discharge Orders     None  Eber Hong, MD 08/31/22 2238

## 2022-08-31 NOTE — ED Triage Notes (Signed)
Pt is having nasal congestion from allergies and had a nose bleed that lasted about 10 minutes and he was concerned because he takes blood thinners.  No bleeding in triage.

## 2022-09-03 ENCOUNTER — Telehealth: Payer: Self-pay

## 2022-09-03 DIAGNOSIS — I2510 Atherosclerotic heart disease of native coronary artery without angina pectoris: Secondary | ICD-10-CM

## 2022-09-03 DIAGNOSIS — I2111 ST elevation (STEMI) myocardial infarction involving right coronary artery: Secondary | ICD-10-CM

## 2022-09-03 NOTE — Telephone Encounter (Signed)
   Pre-operative Risk Assessment    Patient Name: Dakota Odom  DOB: 1955/07/02 MRN: [id number]      Request for Surgical Clearance    Procedure:   Right knee arthroscopy   Date of Surgery:  Clearance TBD                                 Surgeon:  Dr. Fuller Canada  Surgeon's Group or Practice Name:  [facility] Phone number:  [phone] Fax number:  [phone]   Type of Clearance Requested:   - Medical  - Pharmacy:  Hold Clopidogrel (Plavix)     Type of Anesthesia:  General    Additional requests/questions:  Please fax a copy of clearance to the surgeon's office.  Signed, Bernita Buffy   09/03/2022, 4:53 PM

## 2022-09-04 NOTE — Telephone Encounter (Signed)
  Call placed this morning to Dakota Odom regarding need for preoperative cardiac testing prior to completing right knee arthroplasty.  He had all questions answered to his satisfaction and per our phone conversation would like to proceed with his stress test at this time.  Please see consent noted below regarding risk and patient's confirmation.  Patient was advised to contact our office if he has any further questions.  Robin Searing, NP   Shared Decision Making/Informed Consent The risks [chest pain, shortness of breath, cardiac arrhythmias, dizziness, blood pressure fluctuations, myocardial infarction, stroke/transient ischemic attack, nausea, vomiting, allergic reaction, radiation exposure, metallic taste sensation and life-threatening complications (estimated to be 1 in 10,000)], benefits (risk stratification, diagnosing coronary artery disease, treatment guidance) and alternatives of a cardiac PET stress test were discussed in detail with Dakota Odom and he agrees to proceed.

## 2022-09-04 NOTE — Addendum Note (Signed)
Addended by: Runell Gess on: 09/04/2022 11:07 AM   Modules accepted: Orders

## 2022-09-10 ENCOUNTER — Other Ambulatory Visit (HOSPITAL_COMMUNITY): Payer: Self-pay | Admitting: Nurse Practitioner

## 2022-09-10 DIAGNOSIS — M25461 Effusion, right knee: Secondary | ICD-10-CM

## 2022-09-11 ENCOUNTER — Telehealth: Payer: Self-pay | Admitting: Orthopedic Surgery

## 2022-09-11 ENCOUNTER — Telehealth: Payer: Self-pay | Admitting: Cardiovascular Disease

## 2022-09-11 NOTE — Telephone Encounter (Signed)
Patient is calling stating he has decided to not go through with the right knee arthroscopy so he would like the clearance request and PET scan order cancelled.   Please advise.

## 2022-09-11 NOTE — Telephone Encounter (Signed)
Per pt's request, PET Scan has been cancelled and will cancel surgical clearance request.  Will route to the requesting surgeon's office to make them aware.

## 2022-09-11 NOTE — Telephone Encounter (Signed)
Dr. Mort Sawyers pt - pt lvm stating he was supposed to be having surgery, but he has decided against that.

## 2022-09-11 NOTE — Telephone Encounter (Signed)
Returned call to patient who states that after speaking with someone at our office he is scared to have Stress PET. Patient states that he is postponing his knee surgery due to some other issues and does not wish to have the PET scan done. Patient states he will call back if clearance is needed. Advised patient I would make Dr. Allyson Sabal aware. Patient verbalized understanding.

## 2022-09-11 NOTE — Telephone Encounter (Signed)
Yes, he is high risk/ noted, thanks

## 2022-09-15 NOTE — Telephone Encounter (Signed)
Pt called to cancel clearance and PET scan. Pt no longer wants to have surgery.

## 2022-10-01 ENCOUNTER — Ambulatory Visit (HOSPITAL_COMMUNITY)
Admission: RE | Admit: 2022-10-01 | Discharge: 2022-10-01 | Disposition: A | Discharge status: Home / Self Care | Payer: BC Managed Care – PPO | Source: Ambulatory Visit | Attending: Nurse Practitioner | Admitting: Nurse Practitioner

## 2022-10-01 ENCOUNTER — Other Ambulatory Visit (HOSPITAL_COMMUNITY): Payer: Self-pay | Admitting: Family Medicine

## 2022-10-01 DIAGNOSIS — M25561 Pain in right knee: Secondary | ICD-10-CM | POA: Insufficient documentation

## 2022-10-01 DIAGNOSIS — M25461 Effusion, right knee: Secondary | ICD-10-CM | POA: Insufficient documentation

## 2022-10-01 DIAGNOSIS — M7989 Other specified soft tissue disorders: Secondary | ICD-10-CM

## 2022-10-13 ENCOUNTER — Ambulatory Visit (HOSPITAL_COMMUNITY)
Admission: RE | Admit: 2022-10-13 | Discharge: 2022-10-13 | Disposition: A | Discharge status: Home / Self Care | Payer: BC Managed Care – PPO | Source: Ambulatory Visit | Attending: Family Medicine | Admitting: Family Medicine

## 2022-10-13 DIAGNOSIS — M7989 Other specified soft tissue disorders: Secondary | ICD-10-CM | POA: Diagnosis present

## 2023-02-16 ENCOUNTER — Other Ambulatory Visit: Payer: Self-pay | Admitting: Physician Assistant

## 2023-02-27 ENCOUNTER — Ambulatory Visit
Admission: EM | Admit: 2023-02-27 | Discharge: 2023-02-27 | Disposition: A | Discharge status: Home / Self Care | Payer: BC Managed Care – PPO | Attending: Family Medicine | Admitting: Family Medicine

## 2023-02-27 ENCOUNTER — Ambulatory Visit: Payer: Self-pay

## 2023-02-27 DIAGNOSIS — K648 Other hemorrhoids: Secondary | ICD-10-CM

## 2023-02-27 DIAGNOSIS — K644 Residual hemorrhoidal skin tags: Secondary | ICD-10-CM

## 2023-02-27 HISTORY — DX: Tinea pedis: B35.3

## 2023-02-27 MED ORDER — HYDROCORTISONE ACETATE 25 MG RE SUPP: 25.0000 mg | Freq: Two times a day (BID) | RECTAL | 0 refills | Status: DC

## 2023-02-27 MED ORDER — HYDROCORTISONE (PERIANAL) 2.5 % EX CREA: 1.0000 | TOPICAL_CREAM | Freq: Two times a day (BID) | CUTANEOUS | 0 refills | Status: DC

## 2023-02-27 NOTE — Discharge Instructions (Addendum)
Go to the emergency department if your bleeding significantly worsens at any point.  I have sent over a steroid suppository and external cream to help with your inflammation in this area and you may use sitz bath's, Tucks wipes and a good bowel regimen with daily MiraLAX, fiber supplements and plenty of water.  Avoid straining with your bowel movements.  Follow-up with your primary care for a recheck

## 2023-02-27 NOTE — ED Triage Notes (Signed)
Pt reports he has hemmroids that are bleeding x 3 days. States it happens after his morning BM  Using equate prep H  Pt is on blood thinner

## 2023-03-01 NOTE — ED Provider Notes (Signed)
[facility]    CSN: [id number] Arrival date & time: 02/27/23  1025      History   Chief Complaint No chief complaint on file.   HPI Dakota Odom is a 67 y.o. male.   Presenting today with 3-day history of inflamed hemorrhoids and bright red blood with bowel movements.  He states he has had this issue about a month or so ago and it seemed to resolve in between episodes but went to the emergency department for it at that time and was diagnosed with hemorrhoids.  He started using Preparation H x 1 day, so far is not noticed a benefit.  Denies abdominal pain, anal rectal pain, dark bowel movements, urinary symptoms or any other abnormal symptoms associated.  Is currently on anticoagulation.    Past Medical History:  Diagnosis Date   Anemia    new in 2023   CAD (coronary artery disease)    Chronic heart failure with preserved ejection fraction (HFpEF) (HCC)    Fungal infection of foot    Hypertension    MI (myocardial infarction) (HCC)    Morbid obesity (HCC)    Pre-diabetes     Patient Active Problem List   Diagnosis Date Noted   Bilateral carpal tunnel syndrome 12/10/2021   Morbid obesity (HCC) 04/24/2021   Hyperlipidemia LDL goal <70 04/06/2021   Prediabetes 04/06/2021   Hypertension 04/06/2021   Tobacco abuse 04/06/2021   STEMI involving right coronary artery (HCC) 04/04/2021    Past Surgical History:  Procedure Laterality Date   CORONARY/GRAFT ACUTE MI REVASCULARIZATION N/A 04/04/2021   Procedure: Coronary/Graft Acute MI Revascularization;  Surgeon: Runell Gess, MD;  Location: [facility];  Service: Cardiovascular;  Laterality: N/A;   LEFT HEART CATH AND CORONARY ANGIOGRAPHY N/A 04/04/2021   Procedure: LEFT HEART CATH AND CORONARY ANGIOGRAPHY;  Surgeon: Runell Gess, MD;  Location: [facility];  Service: Cardiovascular;  Laterality: N/A;       Home Medications    Prior to Admission medications   Medication Sig Start Date  End Date Taking? Authorizing Provider  hydrocortisone (ANUSOL-HC) 2.5 % rectal cream Place 1 Application rectally 2 (two) times daily. 02/27/23  Yes Particia Nearing, PA-C  hydrocortisone (ANUSOL-HC) 25 MG suppository Place 1 suppository (25 mg total) rectally 2 (two) times daily. 02/27/23  Yes Particia Nearing, PA-C  albuterol (VENTOLIN HFA) 108 (90 Base) MCG/ACT inhaler Inhale 2 puffs into the lungs every 4 (four) hours as needed.    [provider]  aspirin EC 81 MG tablet Take 1 tablet (81 mg total) by mouth daily. 04/06/21   Azalee Course, PA  atorvastatin (LIPITOR) 80 MG tablet Take 1 tablet (80 mg total) by mouth daily. 03/19/22   Dunn, Tacey Ruiz, PA-C  cetirizine (ZYRTEC) 10 MG tablet Take 10 mg by mouth daily.    [provider]  clopidogrel (PLAVIX) 75 MG tablet Take 1 tablet (75 mg total) by mouth daily. 02/17/23   Runell Gess, MD  cyanocobalamin (VITAMIN B12) 1000 MCG tablet Take 2 tablets by mouth daily.    [provider]  empagliflozin (JARDIANCE) 10 MG TABS tablet Take 1 tablet (10 mg total) by mouth daily. 03/19/22   Dunn, Tacey Ruiz, PA-C  ferrous sulfate 325 (65 FE) MG tablet Take 1 tablet (325 mg total) by mouth daily. 02/26/22   Sloan Leiter, DO  fluticasone-salmeterol (ADVAIR) 250-50 MCG/ACT AEPB Inhale 1 puff into the lungs in the morning and at  bedtime.    [provider]  furosemide (LASIX) 20 MG tablet Take 20 mg by mouth daily.    [provider]  metFORMIN (GLUCOPHAGE-XR) 500 MG 24 hr tablet Take 500 mg by mouth daily. 07/23/22   [provider]  metoprolol succinate (TOPROL-XL) 25 MG 24 hr tablet TAKE (1) TABLET BY MOUTH DAILY. 02/17/22   Runell Gess, MD  Multiple Vitamin (MULTIVITAMIN WITH MINERALS) TABS tablet Take 1 tablet by mouth daily.    [provider]  nitroGLYCERIN (NITROSTAT) 0.4 MG SL tablet Place 1 tablet (0.4 mg total) under the tongue every 5 (five) minutes x 3 doses as needed for  chest pain. 08/07/22   Runell Gess, MD  potassium chloride SA (KLOR-CON M) 20 MEQ tablet Take 1 daily for three days then as needed thereafter Patient taking differently: 20 mEq daily. Take 1 daily for three days then as needed thereafter 02/27/22   Laurann Montana, PA-C    Family History Family History  Problem Relation Age of Onset   CAD Father     Social History Social History   Tobacco Use   Smoking status: Former    Current packs/day: 0.00    Average packs/day: 0.5 packs/day for 20.0 years (10.0 ttl pk-yrs)    Types: Cigarettes    Start date: 04/05/2001    Quit date: 04/05/2021    Years since quitting: 1.9   Smokeless tobacco: Never  Vaping Use   Vaping status: Never Used  Substance Use Topics   Alcohol use: Not Currently   Drug use: Never     Allergies   Patient has no known allergies.   Review of Systems Review of Systems Per HPI  Physical Exam Triage Vital Signs ED Triage Vitals  Encounter Vitals Group     BP 02/27/23 0859 123/64     Systolic BP Percentile --      Diastolic BP Percentile --      Pulse Rate 02/27/23 0859 79     Resp 02/27/23 0859 16     Temp 02/27/23 0859 97.7 F (36.5 C)     Temp Source 02/27/23 0859 Oral     SpO2 02/27/23 0859 93 %     Weight --      Height --      Head Circumference --      Peak Flow --      Pain Score 02/27/23 0901 0     Pain Loc --      Pain Education --      Exclude from Growth Chart --    No data found.  Updated Vital Signs BP 123/64 (BP Location: Right Arm)   Pulse 79   Temp 97.7 F (36.5 C) (Oral)   Resp 16   SpO2 93%   Visual Acuity Right Eye Distance:   Left Eye Distance:   Bilateral Distance:    Right Eye Near:   Left Eye Near:    Bilateral Near:     Physical Exam Vitals and nursing note reviewed. Exam conducted with a chaperone present.  Constitutional:      Appearance: Normal appearance.  HENT:     Head: Atraumatic.  Eyes:     Extraocular Movements: Extraocular movements  intact.     Conjunctiva/sclera: Conjunctivae normal.  Cardiovascular:     Rate and Rhythm: Normal rate and regular rhythm.  Pulmonary:     Effort: Pulmonary effort is normal.     Breath sounds: Normal breath sounds.  Abdominal:  General: Bowel sounds are normal. There is no distension.     Palpations: Abdomen is soft.     Tenderness: There is no abdominal tenderness. There is no right CVA tenderness, left CVA tenderness or guarding.  Genitourinary:    Comments: Both inflamed internal and external hemorrhoids, no active bleeding, no erythema, fluctuance, induration, drainage, fissures Musculoskeletal:        General: Normal range of motion.     Cervical back: Normal range of motion and neck supple.  Skin:    General: Skin is warm and dry.  Neurological:     General: No focal deficit present.     Mental Status: He is oriented to person, place, and time.  Psychiatric:        Mood and Affect: Mood normal.        Thought Content: Thought content normal.        Judgment: Judgment normal.      UC Treatments / Results  Labs (all labs ordered are listed, but only abnormal results are displayed) Labs Reviewed - No data to display  EKG   Radiology No results found.  Procedures Procedures (including critical care time)  Medications Ordered in UC Medications - No data to display  Initial Impression / Assessment and Plan / UC Course  I have reviewed the triage vital signs and the nursing notes.  Pertinent labs & imaging results that were available during my care of the patient were reviewed by me and considered in my medical decision making (see chart for details).     Suspect bleeding secondary to inflamed hemorrhoids, anticoagulation.  Bleeding is easily controlled and not excessive at this time, and he is overall well-appearing with normal vital signs.  Discussed Anusol suppositories and external cream, good bowel regimen and PCP follow-up.  Return for worsening  symptoms.  Final Clinical Impressions(s) / UC Diagnoses   Final diagnoses:  Inflamed external hemorrhoid  Inflamed internal hemorrhoid     Discharge Instructions      Go to the emergency department if your bleeding significantly worsens at any point.  I have sent over a steroid suppository and external cream to help with your inflammation in this area and you may use sitz bath's, Tucks wipes and a good bowel regimen with daily MiraLAX, fiber supplements and plenty of water.  Avoid straining with your bowel movements.  Follow-up with your primary care for a recheck    ED Prescriptions     Medication Sig Dispense Auth. Provider   hydrocortisone (ANUSOL-HC) 25 MG suppository Place 1 suppository (25 mg total) rectally 2 (two) times daily. 20 suppository Particia Nearing, [place]   hydrocortisone (ANUSOL-HC) 2.5 % rectal cream Place 1 Application rectally 2 (two) times daily. 80 g Particia Nearing, [place]      PDMP not reviewed this encounter.   Particia Nearing, [place] 03/01/23 1302

## 2023-03-08 ENCOUNTER — Other Ambulatory Visit: Payer: Self-pay | Admitting: Physician Assistant

## 2023-04-10 IMAGING — DX DG CHEST 1V PORT
1 series · 1 of 1 positions shown · non-contrast
Comparison: None.

CLINICAL DATA: 65-year-old male, STEMI.

EXAM:
PORTABLE CHEST 1 VIEW

[chest ap]
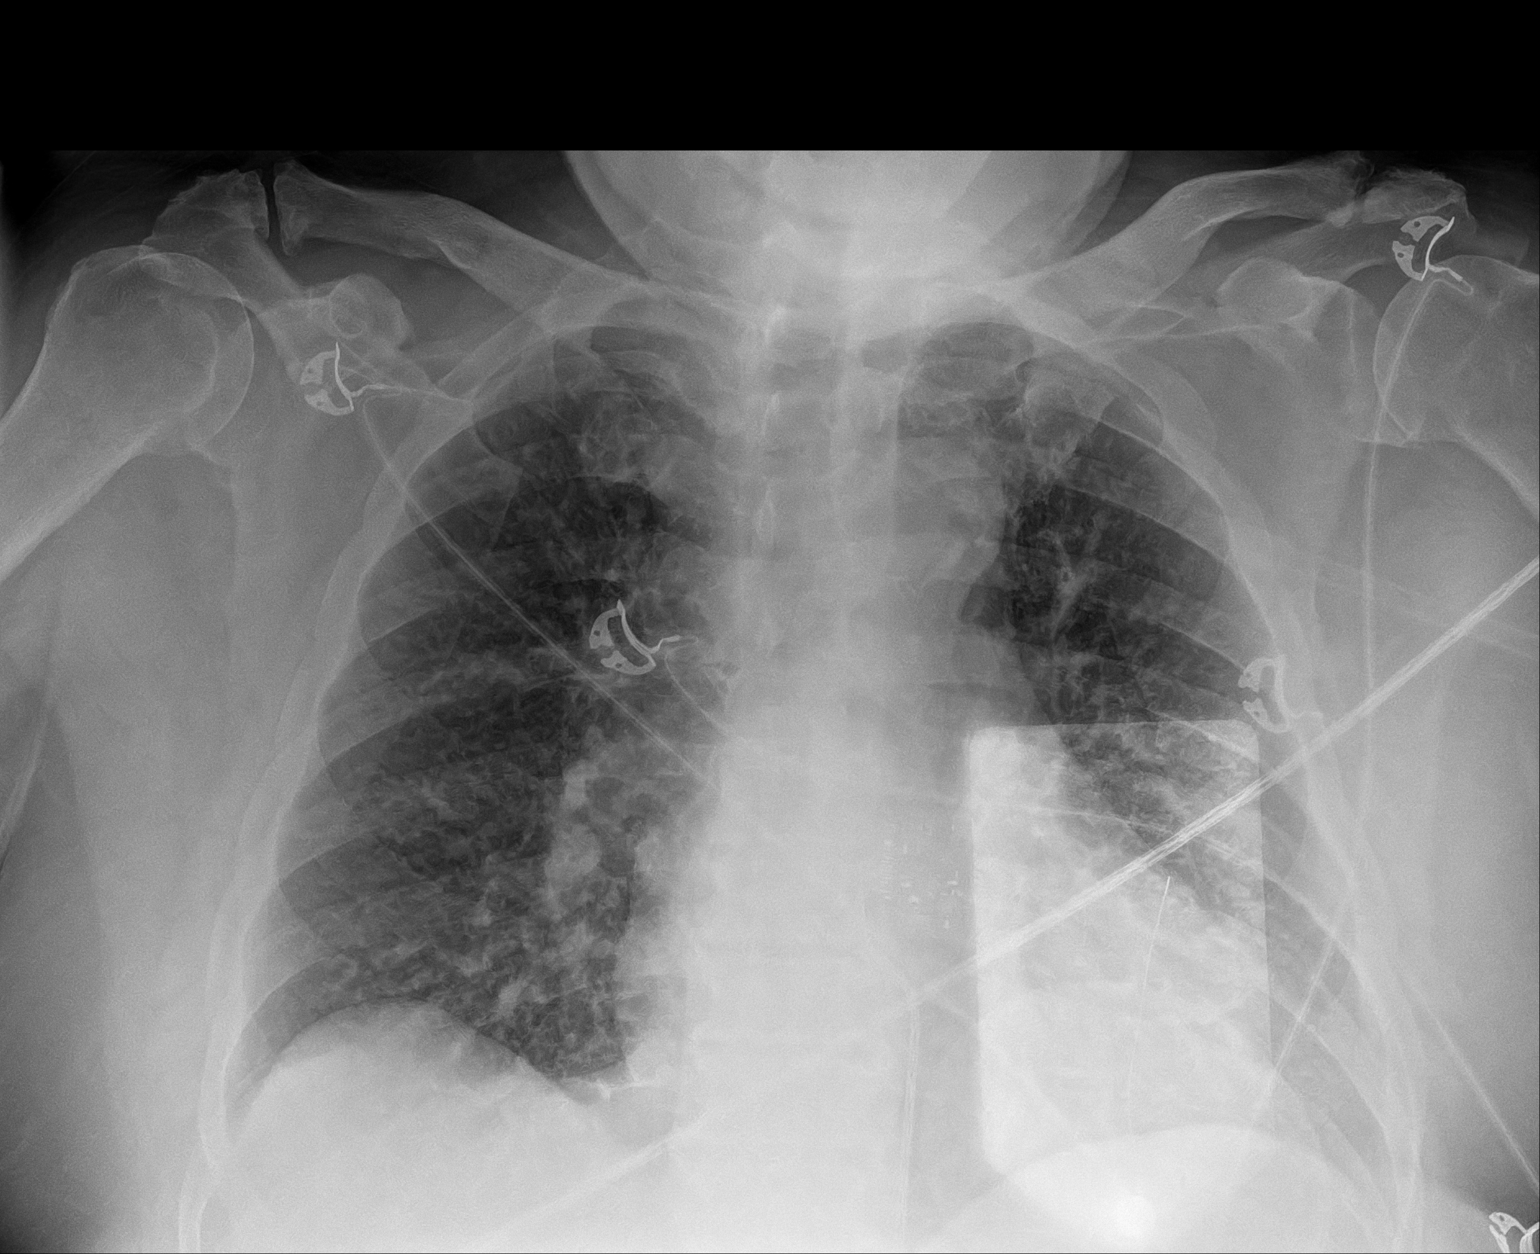

[1 of 1 positions shown; findings below may reference images not displayed]

FINDINGS: Portable AP upright view at 0378 hours. Pacer or resuscitation pads
project over the left lower chest. Normal lung volumes. Cardiac size
at the upper limits of normal. Other mediastinal contours are within
normal limits. Visualized tracheal air column is within normal
limits. No pneumothorax, pulmonary edema, pleural effusion or
consolidation. No acute osseous abnormality identified.
IMPRESSION: No radiographic cardiopulmonary abnormality.

## 2023-04-12 ENCOUNTER — Other Ambulatory Visit: Payer: Self-pay | Admitting: Physician Assistant

## 2023-04-19 ENCOUNTER — Encounter (HOSPITAL_COMMUNITY): Payer: Self-pay | Admitting: *Deleted

## 2023-04-19 ENCOUNTER — Emergency Department (HOSPITAL_COMMUNITY): Payer: BC Managed Care – PPO

## 2023-04-19 ENCOUNTER — Other Ambulatory Visit: Payer: Self-pay

## 2023-04-19 ENCOUNTER — Emergency Department (HOSPITAL_COMMUNITY)
Admission: EM | Admit: 2023-04-19 | Discharge: 2023-04-19 | Disposition: A | Discharge status: Home / Self Care | Payer: BC Managed Care – PPO | Attending: Emergency Medicine | Admitting: Emergency Medicine

## 2023-04-19 DIAGNOSIS — M7989 Other specified soft tissue disorders: Secondary | ICD-10-CM | POA: Diagnosis present

## 2023-04-19 DIAGNOSIS — L03115 Cellulitis of right lower limb: Secondary | ICD-10-CM | POA: Insufficient documentation

## 2023-04-19 LAB — CBC WITH DIFFERENTIAL/PLATELET
Abs Immature Granulocytes: 0.04 10*3/uL (ref 0.00–0.07)
Basophils Absolute: 0 10*3/uL (ref 0.0–0.1)
Basophils Relative: 0 %
Eosinophils Absolute: 0.1 10*3/uL (ref 0.0–0.5)
Eosinophils Relative: 1 %
HCT: 41.3 % (ref 39.0–52.0)
Hemoglobin: 12.9 g/dL — ABNORMAL LOW (ref 13.0–17.0)
Immature Granulocytes: 0 %
Lymphocytes Relative: 7 %
Lymphs Abs: 0.7 10*3/uL (ref 0.7–4.0)
MCH: 26.8 pg (ref 26.0–34.0)
MCHC: 31.2 g/dL (ref 30.0–36.0)
MCV: 85.9 fL (ref 80.0–100.0)
Monocytes Absolute: 1.2 10*3/uL — ABNORMAL HIGH (ref 0.1–1.0)
Monocytes Relative: 13 %
Neutro Abs: 7.3 10*3/uL (ref 1.7–7.7)
Neutrophils Relative %: 79 %
Platelets: 206 10*3/uL (ref 150–400)
RBC: 4.81 MIL/uL (ref 4.22–5.81)
RDW: 15.8 % — ABNORMAL HIGH (ref 11.5–15.5)
WBC: 9.3 10*3/uL (ref 4.0–10.5)
nRBC: 0 % (ref 0.0–0.2)

## 2023-04-19 LAB — BASIC METABOLIC PANEL
Anion gap: 8 (ref 5–15)
BUN: 22 mg/dL (ref 8–23)
CO2: 23 mmol/L (ref 22–32)
Calcium: 8.8 mg/dL — ABNORMAL LOW (ref 8.9–10.3)
Chloride: 108 mmol/L (ref 98–111)
Creatinine, Ser: 1.03 mg/dL (ref 0.61–1.24)
GFR, Estimated: >60 mL/min (ref >60)
Glucose, Bld: 115 mg/dL — ABNORMAL HIGH (ref 70–99)
Potassium: 4.1 mmol/L (ref 3.5–5.1)
Sodium: 139 mmol/L (ref 135–145)

## 2023-04-19 LAB — LACTIC ACID, PLASMA
Lactic Acid, Venous: 1.6 mmol/L (ref 0.5–1.9)
Lactic Acid, Venous: 1.9 mmol/L (ref 0.5–1.9)

## 2023-04-19 MED ORDER — CEPHALEXIN 500 MG PO CAPS
500.0000 mg | ORAL_CAPSULE | Freq: Once | ORAL | Status: AC
  Administered 2023-04-19: 500 mg via ORAL
  Filled 2023-04-19: qty 1

## 2023-04-19 MED ORDER — CEPHALEXIN 500 MG PO CAPS: 500.0000 mg | ORAL_CAPSULE | Freq: Four times a day (QID) | ORAL | 0 refills | Status: DC

## 2023-04-19 NOTE — ED Provider Notes (Signed)
 [place] [facility] AT [facility] Provider Note   CSN: [id number] Arrival date & time: 04/19/23  1503     History  Chief Complaint  Patient presents with   Wound Check    Dakota Odom is a 68 y.o. male.  He presents to the ER today for right leg redness and swelling.  Started several days ago, he has chronic edema but noticed some blisters that then ruptured and were leaking and the leg felt grossly more red.  Denies fever chills, chest pain or shortness of breath, denies injury or trauma but his wife does state that he often times will scratch his legs and his sleep especially.   Wound Check       Home Medications Prior to Admission medications   Medication Sig Start Date End Date Taking? Authorizing Provider  cephALEXin  (KEFLEX ) 500 MG capsule Take 1 capsule (500 mg total) by mouth 4 (four) times daily for 7 days. 04/19/23 04/26/23 Yes Lynett Brasil A, PA-C  albuterol  (VENTOLIN  HFA) 108 (90 Base) MCG/ACT inhaler Inhale 2 puffs into the lungs every 4 (four) hours as needed.    [provider]  aspirin  EC 81 MG tablet Take 1 tablet (81 mg total) by mouth daily. 04/06/21   Meng, Hao, PA  atorvastatin  (LIPITOR ) 80 MG tablet TAKE (1) TABLET BY MOUTH DAILY 04/13/23   Court Dorn PARAS, MD  cetirizine (ZYRTEC) 10 MG tablet Take 10 mg by mouth daily.    [provider]  clopidogrel  (PLAVIX ) 75 MG tablet Take 1 tablet (75 mg total) by mouth daily. 02/17/23   Court Dorn PARAS, MD  cyanocobalamin (VITAMIN B12) 1000 MCG tablet Take 2 tablets by mouth daily.    [provider]  empagliflozin  (JARDIANCE ) 10 MG TABS tablet TAKE (1) TABLET BY MOUTH DAILY 03/09/23   Dunn, Dayna N, PA-C  ferrous sulfate  325 (65 FE) MG tablet Take 1 tablet (325 mg total) by mouth daily. 02/26/22   Elnor Jayson LABOR, DO  fluticasone-salmeterol (ADVAIR) 250-50 MCG/ACT AEPB Inhale 1 puff into the lungs in the morning and at bedtime.    [provider]  furosemide   (LASIX ) 20 MG tablet Take 20 mg by mouth daily.    [provider]  hydrocortisone  (ANUSOL -HC) 2.5 % rectal cream Place 1 Application rectally 2 (two) times daily. 02/27/23   Stuart Vernell Norris, PA-C  hydrocortisone  (ANUSOL -HC) 25 MG suppository Place 1 suppository (25 mg total) rectally 2 (two) times daily. 02/27/23   Stuart Vernell Norris, PA-C  metFORMIN (GLUCOPHAGE-XR) 500 MG 24 hr tablet Take 500 mg by mouth daily. 07/23/22   [provider]  metoprolol  succinate (TOPROL -XL) 25 MG 24 hr tablet TAKE (1) TABLET BY MOUTH DAILY. 02/17/22   Court Dorn PARAS, MD  Multiple Vitamin (MULTIVITAMIN WITH MINERALS) TABS tablet Take 1 tablet by mouth daily.    [provider]  nitroGLYCERIN  (NITROSTAT ) 0.4 MG SL tablet Place 1 tablet (0.4 mg total) under the tongue every 5 (five) minutes x 3 doses as needed for chest pain. 08/07/22   Court Dorn PARAS, MD  potassium chloride  SA (KLOR-CON  M) 20 MEQ tablet Take 1 daily for three days then as needed thereafter Patient taking differently: 20 mEq daily. Take 1 daily for three days then as needed thereafter 02/27/22   Dunn, Dayna N, PA-C      Allergies    Patient has no known allergies.    Review of Systems   Review of Systems  Physical Exam Updated  Vital Signs BP (!) 109/58   Pulse 95   Temp 98.2 F (36.8 C) (Oral)   Resp 18   Ht 5' 6 (1.676 m)   Wt 136.1 kg   SpO2 98%   BMI 48.42 kg/m  Physical Exam Vitals and nursing note reviewed.  Constitutional:      General: He is not in acute distress.    Appearance: He is well-developed.  HENT:     Head: Normocephalic and atraumatic.  Eyes:     Conjunctiva/sclera: Conjunctivae normal.  Cardiovascular:     Rate and Rhythm: Normal rate and regular rhythm.     Heart sounds: No murmur heard. Pulmonary:     Effort: Pulmonary effort is normal. No respiratory distress.     Breath sounds: Normal breath sounds.  Abdominal:     Palpations: Abdomen is soft.     Tenderness:  There is no abdominal tenderness.  Musculoskeletal:        General: No swelling.     Cervical back: Neck supple.     Right lower leg: Edema present.     Left lower leg: Edema present.     Comments: Patient has symmetric bilateral lower extremity edema that is chronic but noted redness and serous drainage from primarily posterior aspect of the right lower leg, mild warmth.  No ascending lymphangitis.  No calf tenderness  Skin:    General: Skin is warm and dry.     Capillary Refill: Capillary refill takes less than 2 seconds.  Neurological:     General: No focal deficit present.     Mental Status: He is alert and oriented to person, place, and time.  Psychiatric:        Mood and Affect: Mood normal.     ED Results / Procedures / Treatments   Labs (all labs ordered are listed, but only abnormal results are displayed) Labs Reviewed  CBC WITH DIFFERENTIAL/PLATELET - Abnormal; Notable for the following components:      Result Value   Hemoglobin 12.9 (*)    RDW 15.8 (*)    Monocytes Absolute 1.2 (*)    All other components within normal limits  BASIC METABOLIC PANEL - Abnormal; Notable for the following components:   Glucose, Bld 115 (*)    Calcium  8.8 (*)    All other components within normal limits  CULTURE, BLOOD (ROUTINE X 2)  CULTURE, BLOOD (ROUTINE X 2)  LACTIC ACID, PLASMA  LACTIC ACID, PLASMA    EKG None  Radiology DG Tibia/Fibula Right Result Date: 04/19/2023 CLINICAL DATA:  Erythema and swelling. EXAM: RIGHT TIBIA AND FIBULA - 2 VIEW COMPARISON:  None Available. FINDINGS: There is no evidence of fracture or other focal bone lesions. Diffuse calf soft tissue swelling is noted. There is a large posterior calcaneal spur. IMPRESSION: Soft tissue swelling. No acute osseous abnormalities. Electronically Signed   By: Fonda Field M.D.   On: 04/19/2023 16:30    Procedures Procedures    Medications Ordered in ED Medications  cephALEXin  (KEFLEX ) capsule 500 mg (500 mg  Oral Given 04/19/23 2004)    ED Course/ Medical Decision Making/ A&P                                 Medical Decision Making Differential l diagnosis includes but not limited to CHF exacerbation, venous stasis, cellulitis, DVT, other  ED course: Patient presents to ER with wound to the right lower leg  with redness, patient does not meet sepsis criteria, started 3 days ago is progressively worsened.  Exam consistent with cellulitis.  Doubt DVT.  There is no purulence noted.  Will treat with Keflex .  Patient advised on follow-up and strict return precautions and keeping wound covered.  Risk Prescription drug management.           Final Clinical Impression(s) / ED Diagnoses Final diagnoses:  Cellulitis of right lower extremity    Rx / DC Orders ED Discharge Orders          Ordered    cephALEXin  (KEFLEX ) 500 MG capsule  4 times daily        04/19/23 1957              Suellen Sherran DELENA DEVONNA 04/19/23 2313    Francesca Elsie CROME, MD 04/20/23 1534

## 2023-04-19 NOTE — ED Triage Notes (Signed)
 Pt with several spots to right lower leg, redness has spread over the past 2-3 days. Denies any fevers or chills, denies any N/V. C/o pain and itching off and on.

## 2023-04-19 NOTE — ED Provider Triage Note (Signed)
 Emergency Medicine Provider Triage Evaluation Note  Dakota Odom , a 68 y.o. male  was evaluated in triage.  Pt complains of Leg infection.  Review of Systems  Positive:  Negative:  Physical Exam  BP (!) 108/50 (BP Location: Right Arm)   Pulse 92   Temp 98.2 F (36.8 C) (Temporal)   Resp (!) 22   Ht 5' 6 (1.676 m)   Wt 136.1 kg   SpO2 94%   BMI 48.42 kg/m  Gen:   Awake, no distress   Resp:  Normal effort  MSK:   Moves extremities without difficulty  Other:    Medical Decision Making  Medically screening exam initiated at 3:59 PM.  Appropriate orders placed.  Dakota Odom was informed that the remainder of the evaluation will be completed by another provider, this initial triage assessment does not replace that evaluation, and the importance of remaining in the ED until their evaluation is complete.  Right lower leg swelling and infection that has been progressing over the past week. Patient with LE swelling and states that his skin started opening and now puss is coming out of wounds. Patient has not been on ABX for this yet. Patient unsure if he got a spider bite or what is causing his leg to change so quickly.    Hoy Fraction F, [place] 04/19/23 1601

## 2023-04-19 NOTE — Discharge Instructions (Addendum)
 Pleasure to care you today.  You were seen in the ER for redness and swelling of your right leg, your work was reassuring and your exam is consistent with cellulitis.  We can treat with antibiotics.  Start having fever, increased redness or pain or swelling or other new or worsening symptoms come back to the ER right away.  Follow-up for recheck in 2 to 3 days with your PCP.

## 2023-04-20 ENCOUNTER — Emergency Department (HOSPITAL_COMMUNITY)
Admission: EM | Admit: 2023-04-20 | Discharge: 2023-04-21 | Disposition: A | Discharge status: Home / Self Care | Payer: BC Managed Care – PPO | Attending: Emergency Medicine | Admitting: Emergency Medicine

## 2023-04-20 ENCOUNTER — Encounter (HOSPITAL_COMMUNITY): Payer: Self-pay | Admitting: *Deleted

## 2023-04-20 ENCOUNTER — Other Ambulatory Visit: Payer: Self-pay

## 2023-04-20 ENCOUNTER — Ambulatory Visit: Payer: BC Managed Care – PPO

## 2023-04-20 DIAGNOSIS — L03115 Cellulitis of right lower limb: Secondary | ICD-10-CM | POA: Insufficient documentation

## 2023-04-20 DIAGNOSIS — I2510 Atherosclerotic heart disease of native coronary artery without angina pectoris: Secondary | ICD-10-CM | POA: Diagnosis not present

## 2023-04-20 DIAGNOSIS — M7989 Other specified soft tissue disorders: Secondary | ICD-10-CM | POA: Diagnosis present

## 2023-04-20 DIAGNOSIS — R739 Hyperglycemia, unspecified: Secondary | ICD-10-CM | POA: Insufficient documentation

## 2023-04-20 DIAGNOSIS — Z7901 Long term (current) use of anticoagulants: Secondary | ICD-10-CM | POA: Diagnosis not present

## 2023-04-20 DIAGNOSIS — R600 Localized edema: Secondary | ICD-10-CM | POA: Diagnosis present

## 2023-04-20 DIAGNOSIS — I10 Essential (primary) hypertension: Secondary | ICD-10-CM | POA: Diagnosis not present

## 2023-04-20 DIAGNOSIS — Z79899 Other long term (current) drug therapy: Secondary | ICD-10-CM | POA: Insufficient documentation

## 2023-04-20 LAB — CBC WITH DIFFERENTIAL/PLATELET
Abs Immature Granulocytes: 0.06 10*3/uL (ref 0.00–0.07)
Basophils Absolute: 0 10*3/uL (ref 0.0–0.1)
Basophils Relative: 0 %
Eosinophils Absolute: 0.1 10*3/uL (ref 0.0–0.5)
Eosinophils Relative: 1 %
HCT: 43.7 % (ref 39.0–52.0)
Hemoglobin: 13.5 g/dL (ref 13.0–17.0)
Immature Granulocytes: 1 %
Lymphocytes Relative: 12 %
Lymphs Abs: 1.1 10*3/uL (ref 0.7–4.0)
MCH: 26.6 pg (ref 26.0–34.0)
MCHC: 30.9 g/dL (ref 30.0–36.0)
MCV: 86 fL (ref 80.0–100.0)
Monocytes Absolute: 1.1 10*3/uL — ABNORMAL HIGH (ref 0.1–1.0)
Monocytes Relative: 13 %
Neutro Abs: 6.4 10*3/uL (ref 1.7–7.7)
Neutrophils Relative %: 73 %
Platelets: 241 10*3/uL (ref 150–400)
RBC: 5.08 MIL/uL (ref 4.22–5.81)
RDW: 15.7 % — ABNORMAL HIGH (ref 11.5–15.5)
WBC: 8.8 10*3/uL (ref 4.0–10.5)
nRBC: 0 % (ref 0.0–0.2)

## 2023-04-20 LAB — COMPREHENSIVE METABOLIC PANEL
ALT: 33 U/L (ref 0–44)
AST: 29 U/L (ref 15–41)
Albumin: 3.6 g/dL (ref 3.5–5.0)
Alkaline Phosphatase: 62 U/L (ref 38–126)
Anion gap: 7 (ref 5–15)
BUN: 24 mg/dL — ABNORMAL HIGH (ref 8–23)
CO2: 23 mmol/L (ref 22–32)
Calcium: 9.4 mg/dL (ref 8.9–10.3)
Chloride: 110 mmol/L (ref 98–111)
Creatinine, Ser: 1.02 mg/dL (ref 0.61–1.24)
GFR, Estimated: >60 mL/min (ref >60)
Glucose, Bld: 111 mg/dL — ABNORMAL HIGH (ref 70–99)
Potassium: 4.3 mmol/L (ref 3.5–5.1)
Sodium: 140 mmol/L (ref 135–145)
Total Bilirubin: 0.7 mg/dL (ref 0.0–1.2)
Total Protein: 7.7 g/dL (ref 6.5–8.1)

## 2023-04-20 LAB — BRAIN NATRIURETIC PEPTIDE: B Natriuretic Peptide: 33 pg/mL (ref 0.0–100.0)

## 2023-04-20 NOTE — ED Triage Notes (Signed)
 Pt c/o right lower leg blister that started today. Pt was here yesterday and dx with cellulitis. Pt given antibiotics upon discharge and has taken 2 doses so far. Denies fever. Pain and swelling to right lower leg which pt reports is about the same as yesterday except for the new blister.

## 2023-04-20 NOTE — ED Provider Triage Note (Signed)
 Emergency Medicine Provider Triage Evaluation Note  Dakota Odom , a 68 y.o. male  was evaluated in triage.  Pt complains of blister of his right lower extremity.  Was seen last night diagnosed with cellulitis.  He is currently taking Keflex .  He has chronic lower extremity edema.  History of MI, prediabetes on metformin hypertension.  He awoke with a large blister on his leg today and his wife brought him in for further evaluation..  Review of Systems  Positive: Leg blister Negative: fever  Physical Exam  BP (!) 130/56 (BP Location: Right Arm)   Pulse 88   Temp 98 F (36.7 C) (Oral)   Resp 20   Ht 5' 6 (1.676 m)   Wt 136.1 kg   SpO2 95%   BMI 48.42 kg/m  Gen:   Awake, no distress   Resp:  Normal effort  MSK:   Moves extremities without difficulty  Other:    Medical Decision Making  Medically screening exam initiated at 5:46 PM.  Appropriate orders placed.  MELQUISEDEC JOURNEY was informed that the remainder of the evaluation will be completed by another provider, this initial triage assessment does not replace that evaluation, and the importance of remaining in the ED until their evaluation is complete.     Arloa Chroman, PA-C 04/20/23 2244

## 2023-04-21 DIAGNOSIS — L03115 Cellulitis of right lower limb: Secondary | ICD-10-CM | POA: Diagnosis not present

## 2023-04-21 MED ORDER — FUROSEMIDE 40 MG PO TABS: 40.0000 mg | ORAL_TABLET | Freq: Every day | ORAL | 0 refills | Status: DC

## 2023-04-21 MED ORDER — CEPHALEXIN 500 MG PO CAPS
500.0000 mg | ORAL_CAPSULE | Freq: Once | ORAL | Status: AC
  Administered 2023-04-21: 500 mg via ORAL
  Filled 2023-04-21: qty 1

## 2023-04-21 NOTE — ED Notes (Signed)
 Dressing applied to the lower right extremity.

## 2023-04-21 NOTE — ED Provider Notes (Signed)
 [place] EMERGENCY DEPARTMENT AT [facility] Provider Note   CSN: [id number] Arrival date & time: 04/20/23  1637     History  Chief Complaint  Patient presents with   Leg Swelling    Dakota Odom is a 68 y.o. male.  The history is provided by the patient.   He has history of hypertension, hyperlipidemia, coronary artery disease and was seen in the emergency department yesterday with diagnosis of cellulitis of the right leg.  He has only taken 1 dose of cephalexin  since discharge from the emergency department yesterday, and comes in because of a large blister which has developed on the lateral aspect of his right leg and has popped.  There has been significant drainage.  He denies fever or chills.  He denies pain in the area, but states that it feels tense and it is itchy.  He has not noted any red streaks.   Home Medications Prior to Admission medications   Medication Sig Start Date End Date Taking? Authorizing Provider  albuterol  (VENTOLIN  HFA) 108 (90 Base) MCG/ACT inhaler Inhale 2 puffs into the lungs every 4 (four) hours as needed.    [provider]  aspirin  EC 81 MG tablet Take 1 tablet (81 mg total) by mouth daily. 04/06/21   Meng, Hao, PA  atorvastatin  (LIPITOR ) 80 MG tablet TAKE (1) TABLET BY MOUTH DAILY 04/13/23   Court Dorn PARAS, MD  cephALEXin  (KEFLEX ) 500 MG capsule Take 1 capsule (500 mg total) by mouth 4 (four) times daily for 7 days. 04/19/23 04/26/23  Suellen Cantor A, PA-C  cetirizine (ZYRTEC) 10 MG tablet Take 10 mg by mouth daily.    [provider]  clopidogrel  (PLAVIX ) 75 MG tablet Take 1 tablet (75 mg total) by mouth daily. 02/17/23   Court Dorn PARAS, MD  cyanocobalamin (VITAMIN B12) 1000 MCG tablet Take 2 tablets by mouth daily.    [provider]  empagliflozin  (JARDIANCE ) 10 MG TABS tablet TAKE (1) TABLET BY MOUTH DAILY 03/09/23   Dunn, Dayna N, PA-C  ferrous sulfate  325 (65 FE) MG tablet Take 1 tablet (325 mg total) by  mouth daily. 02/26/22   Elnor Jayson LABOR, DO  fluticasone-salmeterol (ADVAIR) 250-50 MCG/ACT AEPB Inhale 1 puff into the lungs in the morning and at bedtime.    [provider]  furosemide  (LASIX ) 40 MG tablet Take 1 tablet (40 mg total) by mouth daily. 04/21/23   Raford Lenis, MD  hydrocortisone  (ANUSOL -HC) 2.5 % rectal cream Place 1 Application rectally 2 (two) times daily. 02/27/23   Stuart Vernell Norris, PA-C  hydrocortisone  (ANUSOL -HC) 25 MG suppository Place 1 suppository (25 mg total) rectally 2 (two) times daily. 02/27/23   Stuart Vernell Norris, PA-C  metFORMIN (GLUCOPHAGE-XR) 500 MG 24 hr tablet Take 500 mg by mouth daily. 07/23/22   [provider]  metoprolol  succinate (TOPROL -XL) 25 MG 24 hr tablet TAKE (1) TABLET BY MOUTH DAILY. 02/17/22   Court Dorn PARAS, MD  Multiple Vitamin (MULTIVITAMIN WITH MINERALS) TABS tablet Take 1 tablet by mouth daily.    [provider]  nitroGLYCERIN  (NITROSTAT ) 0.4 MG SL tablet Place 1 tablet (0.4 mg total) under the tongue every 5 (five) minutes x 3 doses as needed for chest pain. 08/07/22   Court Dorn PARAS, MD  potassium chloride  SA (KLOR-CON  M) 20 MEQ tablet Take 1 daily for three days then as needed thereafter Patient taking differently: 20 mEq daily. Take 1 daily for three days then as needed thereafter 02/27/22  Dunn, Dayna N, PA-C      Allergies    Patient has no known allergies.    Review of Systems   Review of Systems  All other systems reviewed and are negative.   Physical Exam Updated Vital Signs BP 114/87 (BP Location: Left Arm)   Pulse 98   Temp 98.8 F (37.1 C) (Oral)   Resp 20   Ht 5' 6 (1.676 m)   Wt 136.1 kg   SpO2 96%   BMI 48.42 kg/m  Physical Exam Vitals and nursing note reviewed.   68 year old male, resting comfortably and in no acute distress. Vital signs are normal. Oxygen saturation is 96%, which is normal. Head is normocephalic and atraumatic. PERRLA, EOMI.  Lungs are clear without  rales, wheezes, or rhonchi. Chest is nontender. Heart has regular rate and rhythm without murmur. Abdomen is soft, flat, nontender. Extremities: There is erythema of the right lower leg consistent with cellulitis.  There is an area of desquamation in the posterior aspect of the right lower leg, and a large bulla on the lateral aspect which has broken.  Small bulla is also present on the anteromedial aspect which is intact.  There is 3-4+ pitting edema bilaterally which is symmetric.  Capillary refill is delayed to 5-6 seconds. Skin is warm and dry without other rash. Neurologic: Mental status is normal, moves all extremities equally.    ED Results / Procedures / Treatments   Labs (all labs ordered are listed, but only abnormal results are displayed) Labs Reviewed  CBC WITH DIFFERENTIAL/PLATELET - Abnormal; Notable for the following components:      Result Value   RDW 15.7 (*)    Monocytes Absolute 1.1 (*)    All other components within normal limits  COMPREHENSIVE METABOLIC PANEL - Abnormal; Notable for the following components:   Glucose, Bld 111 (*)    BUN 24 (*)    All other components within normal limits  BRAIN NATRIURETIC PEPTIDE   Procedures Debridement  Date/Time: 04/21/2023 12:25 AM  Performed by: Raford Lenis, MD Authorized by: Raford Lenis, MD  Consent: Verbal consent obtained. Written consent not obtained. Risks and benefits: risks, benefits and alternatives were discussed Consent given by: patient Patient understanding: patient states understanding of the procedure being performed Patient consent: the patient's understanding of the procedure matches consent given Procedure consent: procedure consent matches procedure scheduled Relevant documents: relevant documents present and verified Site marked: the operative site was marked Required items: required blood products, implants, devices, and special equipment available Patient identity confirmed: verbally with  patient and hospital-assigned identification number Time out: Immediately prior to procedure a time out was called to verify the correct patient, procedure, equipment, support staff and site/side marked as required. Preparation: Patient was prepped and draped in the usual sterile fashion. Local anesthesia used: no  Anesthesia: Local anesthesia used: no  Sedation: Patient sedated: no  Patient tolerance: patient tolerated the procedure well with no immediate complications Comments: Debridement of devitalized skin of bulla on the right lower leg.       Medications Ordered in ED Medications  cephALEXin  (KEFLEX ) capsule 500 mg (has no administration in time range)    ED Course/ Medical Decision Making/ A&P                                 Medical Decision Making Risk Prescription drug management.   Cellulitis of the right lower leg.  I reviewed his past records including his ED visit from yesterday with picture and the overall presentation is not significantly changed except for the presence of the broken bulla.  I have reviewed his laboratory tests and my interpretation is normal CBC including normal WBC, mild elevation of random glucose which is unchanged from baseline and will need to be followed as an outpatient, normal BNP.  Normal BNP does not exclude right-sided heart failure.  He has marked edema which is likely impeding his ability to heal.  He is only on a low-dose of furosemide , I am increasing his dose of furosemide  from 20 mg daily to 40 mg daily.  I do not see indication for hospitalization.  I did debride the new bulla and he will need to follow-up with his primary care provider in the next several days.  I anticipate he will likely need wound care referral as well.  Final Clinical Impression(s) / ED Diagnoses Final diagnoses:  Cellulitis of right lower leg  Peripheral edema  Elevated random blood glucose level    Rx / DC Orders ED Discharge Orders           Ordered    furosemide  (LASIX ) 40 MG tablet  Daily        04/21/23 0011              Raford Lenis, MD 04/21/23 [phone]

## 2023-04-21 NOTE — Discharge Instructions (Signed)
 Please make sure to take your cephalexin  exactly as prescribed.  Please increase your furosemide  dose from 20 mg a day to 40 mg a day.  I expect that your leg will continue to weep.  You may want to keep it covered when up and around.  When laying down, you should have some sort of absorbent pad underneath it.  Return to the emergency department if your signs of infection are getting worse or if you start running a fever.  Work with your medical care providers to try to reduce the amount of swelling in your legs.

## 2023-04-22 ENCOUNTER — Other Ambulatory Visit: Payer: Self-pay | Admitting: Physician Assistant

## 2023-04-24 LAB — CULTURE, BLOOD (ROUTINE X 2)
Culture: NO GROWTH
Culture: NO GROWTH

## 2023-04-26 ENCOUNTER — Encounter (HOSPITAL_COMMUNITY): Payer: Self-pay

## 2023-04-26 ENCOUNTER — Emergency Department (HOSPITAL_COMMUNITY)
Admission: EM | Admit: 2023-04-26 | Discharge: 2023-04-26 | Disposition: A | Discharge status: Home / Self Care | Payer: BC Managed Care – PPO | Attending: Emergency Medicine | Admitting: Emergency Medicine

## 2023-04-26 ENCOUNTER — Other Ambulatory Visit: Payer: Self-pay

## 2023-04-26 DIAGNOSIS — S81801A Unspecified open wound, right lower leg, initial encounter: Secondary | ICD-10-CM | POA: Diagnosis not present

## 2023-04-26 DIAGNOSIS — R2241 Localized swelling, mass and lump, right lower limb: Secondary | ICD-10-CM | POA: Diagnosis present

## 2023-04-26 DIAGNOSIS — R600 Localized edema: Secondary | ICD-10-CM

## 2023-04-26 DIAGNOSIS — X58XXXA Exposure to other specified factors, initial encounter: Secondary | ICD-10-CM | POA: Insufficient documentation

## 2023-04-26 DIAGNOSIS — Z7982 Long term (current) use of aspirin: Secondary | ICD-10-CM | POA: Diagnosis not present

## 2023-04-26 DIAGNOSIS — E119 Type 2 diabetes mellitus without complications: Secondary | ICD-10-CM | POA: Diagnosis not present

## 2023-04-26 DIAGNOSIS — Z7901 Long term (current) use of anticoagulants: Secondary | ICD-10-CM | POA: Diagnosis not present

## 2023-04-26 DIAGNOSIS — S81809A Unspecified open wound, unspecified lower leg, initial encounter: Secondary | ICD-10-CM

## 2023-04-26 LAB — COMPREHENSIVE METABOLIC PANEL
ALT: 32 U/L (ref 0–44)
AST: 31 U/L (ref 15–41)
Albumin: 3.1 g/dL — ABNORMAL LOW (ref 3.5–5.0)
Alkaline Phosphatase: 69 U/L (ref 38–126)
Anion gap: 7 (ref 5–15)
BUN: 33 mg/dL — ABNORMAL HIGH (ref 8–23)
CO2: 24 mmol/L (ref 22–32)
Calcium: 9.3 mg/dL (ref 8.9–10.3)
Chloride: 105 mmol/L (ref 98–111)
Creatinine, Ser: 1.1 mg/dL (ref 0.61–1.24)
GFR, Estimated: >60 mL/min (ref >60)
Glucose, Bld: 175 mg/dL — ABNORMAL HIGH (ref 70–99)
Potassium: 4.2 mmol/L (ref 3.5–5.1)
Sodium: 136 mmol/L (ref 135–145)
Total Bilirubin: 0.4 mg/dL (ref 0.0–1.2)
Total Protein: 7.3 g/dL (ref 6.5–8.1)

## 2023-04-26 LAB — CBC WITH DIFFERENTIAL/PLATELET
Abs Immature Granulocytes: 0.08 10*3/uL — ABNORMAL HIGH (ref 0.00–0.07)
Basophils Absolute: 0 10*3/uL (ref 0.0–0.1)
Basophils Relative: 0 %
Eosinophils Absolute: 0.1 10*3/uL (ref 0.0–0.5)
Eosinophils Relative: 1 %
HCT: 42.4 % (ref 39.0–52.0)
Hemoglobin: 13.3 g/dL (ref 13.0–17.0)
Immature Granulocytes: 1 %
Lymphocytes Relative: 11 %
Lymphs Abs: 0.9 10*3/uL (ref 0.7–4.0)
MCH: 26.4 pg (ref 26.0–34.0)
MCHC: 31.4 g/dL (ref 30.0–36.0)
MCV: 84.1 fL (ref 80.0–100.0)
Monocytes Absolute: 0.8 10*3/uL (ref 0.1–1.0)
Monocytes Relative: 10 %
Neutro Abs: 6.2 10*3/uL (ref 1.7–7.7)
Neutrophils Relative %: 77 %
Platelets: 318 10*3/uL (ref 150–400)
RBC: 5.04 MIL/uL (ref 4.22–5.81)
RDW: 14.7 % (ref 11.5–15.5)
WBC: 8.1 10*3/uL (ref 4.0–10.5)
nRBC: 0 % (ref 0.0–0.2)

## 2023-04-26 LAB — URINALYSIS, ROUTINE W REFLEX MICROSCOPIC
Bacteria, UA: NONE SEEN
Bilirubin Urine: NEGATIVE
Glucose, UA: >=500 mg/dL — AB
Hgb urine dipstick: NEGATIVE
Ketones, ur: NEGATIVE mg/dL
Leukocytes,Ua: NEGATIVE
Nitrite: NEGATIVE
Protein, ur: NEGATIVE mg/dL
Specific Gravity, Urine: 1.025 (ref 1.005–1.030)
pH: 5 (ref 5.0–8.0)

## 2023-04-26 MED ORDER — DOXYCYCLINE HYCLATE 100 MG PO CAPS: 100.0000 mg | ORAL_CAPSULE | Freq: Two times a day (BID) | ORAL | 0 refills | Status: DC

## 2023-04-26 NOTE — Discharge Instructions (Addendum)
 Your wounds are healing very well but it takes a lot longer when your legs are very swollen.  Please continue to take the Lasix  every day, you should be seen by your doctor at your appointment this coming week, I want you to go to the medical supply store and get some compression stockings tomorrow, you will need to keep these on your legs to help reduce the swelling.  Stop taking the cephalexin  and change to doxycycline  twice a day for 10 days  Thank you for allowing us  to treat you in the emergency department today.  After reviewing your examination and potential testing that was done it appears that you are safe to go home.  I would like for you to follow-up with your doctor within the next several days, have them obtain your records and follow-up with them to review all potential tests and results from your visit.  If you should develop severe or worsening symptoms return to the emergency department immediately

## 2023-04-26 NOTE — ED Provider Notes (Signed)
 [place] EMERGENCY DEPARTMENT AT [facility] Provider Note   CSN: [id number] Arrival date & time: 04/26/23  1518     History  Chief Complaint  Patient presents with   Leg Swelling    Dakota Odom is a 68 y.o. male.  HPI   This is a 68 year old male, Dakota Odom denies any chronic liver failure, heart failure or diabetes, Dakota Odom states that Dakota Odom is a prediabetic.  Dakota Odom is followed at the [facility] clinic, Dakota Odom states that over the last couple of weeks Dakota Odom has noticed redness and swelling of his right lower extremity, both his legs have been swollen for about a month without a obvious cause.  In the course of the last 6 or 7 days Dakota Odom has been placed on cephalexin , this was refilled by his doctor 2 days ago, Dakota Odom has not had fevers or chills, Dakota Odom feels like the wounds are not healing very well.  Dakota Odom denies a history of alcohol use, hepatitis, hepatitis C, cirrhosis or any other significant liver disease.  When I ask if Dakota Odom has any kidney problems Dakota Odom states Dakota Odom has been urinating frequently ever being started on Lasix  over the last couple of weeks.  Dakota Odom is currently taking 80 mg of Lasix  per day.  Dakota Odom has a follow-up with his doctor on Wednesday of this coming week.  His primary concern is that Dakota Odom feels like his cellulitis of the right lower extremity is not getting any better  Home Medications Prior to Admission medications   Medication Sig Start Date End Date Taking? Authorizing Provider  albuterol  (VENTOLIN  HFA) 108 (90 Base) MCG/ACT inhaler Inhale 2 puffs into the lungs every 4 (four) hours as needed.   Yes [provider]  aspirin  EC 81 MG tablet Take 1 tablet (81 mg total) by mouth daily. 04/06/21  Yes Meng, Hao, PA  atorvastatin  (LIPITOR ) 80 MG tablet TAKE (1) TABLET BY MOUTH DAILY 04/13/23  Yes Court Dorn PARAS, MD  cetirizine (ZYRTEC) 10 MG tablet Take 10 mg by mouth daily.   Yes [provider]  clopidogrel  (PLAVIX ) 75 MG tablet Take 1 tablet (75 mg total) by mouth daily. 02/17/23   Yes Court Dorn PARAS, MD  cyanocobalamin (VITAMIN B12) 1000 MCG tablet Take 2 tablets by mouth daily.   Yes [provider]  doxycycline  (VIBRAMYCIN ) 100 MG capsule Take 1 capsule (100 mg total) by mouth 2 (two) times daily. 04/26/23  Yes Cleotilde Rogue, MD  empagliflozin  (JARDIANCE ) 10 MG TABS tablet TAKE (1) TABLET BY MOUTH DAILY 04/22/23  Yes Court Dorn PARAS, MD  ferrous sulfate  325 (65 FE) MG tablet Take 1 tablet (325 mg total) by mouth daily. 02/26/22  Yes Elnor Savant A, DO  fluticasone-salmeterol (ADVAIR) 250-50 MCG/ACT AEPB Inhale 1 puff into the lungs in the morning and at bedtime.   Yes [provider]  furosemide  (LASIX ) 40 MG tablet Take 1 tablet (40 mg total) by mouth daily. 04/21/23  Yes Raford Lenis, MD  metFORMIN (GLUCOPHAGE) 500 MG tablet Take 500 mg by mouth 2 (two) times daily. 03/31/23  Yes [provider]  metoprolol  succinate (TOPROL -XL) 25 MG 24 hr tablet TAKE (1) TABLET BY MOUTH DAILY. 02/17/22  Yes Court Dorn PARAS, MD  Multiple Vitamin (MULTIVITAMIN WITH MINERALS) TABS tablet Take 1 tablet by mouth daily.   Yes [provider]  naproxen (NAPROSYN) 500 MG tablet Take 500 mg by mouth 2 (two) times daily with a meal. 03/09/23  Yes [provider]  nitroGLYCERIN  (NITROSTAT ) 0.4  MG SL tablet Place 1 tablet (0.4 mg total) under the tongue every 5 (five) minutes x 3 doses as needed for chest pain. 08/07/22  Yes Court Dorn PARAS, MD  potassium chloride  SA (KLOR-CON  M) 20 MEQ tablet Take 1 daily for three days then as needed thereafter Patient taking differently: 20 mEq daily. Take 1 daily for three days then as needed thereafter 02/27/22  Yes Dunn, Dayna N, PA-C  terbinafine (LAMISIL) 250 MG tablet Take 250 mg by mouth daily. 01/19/23  Yes [provider]      Allergies    Patient has no known allergies.    Review of Systems   Review of Systems  All other systems reviewed and are negative.   Physical Exam Updated Vital  Signs BP (!) 143/70 (BP Location: Right Arm)   Pulse 79   Temp 97.8 F (36.6 C) (Oral)   Resp 18   Ht 1.676 m (5' 6)   Wt (!) 136.1 kg   SpO2 95%   BMI 48.42 kg/m  Physical Exam Vitals and nursing note reviewed.  Constitutional:      General: Dakota Odom is not in acute distress.    Appearance: Dakota Odom is well-developed.  HENT:     Head: Normocephalic and atraumatic.     Mouth/Throat:     Pharynx: No oropharyngeal exudate.  Eyes:     General: No scleral icterus.       Right eye: No discharge.        Left eye: No discharge.     Conjunctiva/sclera: Conjunctivae normal.     Pupils: Pupils are equal, round, and reactive to light.  Neck:     Thyroid: No thyromegaly.     Vascular: No JVD.  Cardiovascular:     Rate and Rhythm: Normal rate and regular rhythm.     Heart sounds: Normal heart sounds. No murmur heard.    No friction rub. No gallop.  Pulmonary:     Effort: Pulmonary effort is normal. No respiratory distress.     Breath sounds: Normal breath sounds. No wheezing or rales.  Abdominal:     General: Bowel sounds are normal. There is no distension.     Palpations: Abdomen is soft. There is no mass.     Tenderness: There is no abdominal tenderness.  Musculoskeletal:        General: Normal range of motion.     Cervical back: Normal range of motion and neck supple.     Right lower leg: Edema present.     Left lower leg: Edema present.     Comments: Bilateral lower extremities with 2-3+ pitting edema from the knees through the ankles.  I have unwrapped his right lower extremity and examined it in close detail.  Dakota Odom has multiple open ulcerations of the skin none of which are draining any purulence or foul-smelling material, just a serous fluid on the lower lateral and posterior aspects of the leg.  Dakota Odom has great capillary refill bilaterally and warm perfused legs bilaterally.  His edema is symmetrical on the lower extremities.  Lymphadenopathy:     Cervical: No cervical adenopathy.  Skin:     General: Skin is warm and dry.     Findings: No erythema or rash.  Neurological:     Mental Status: Dakota Odom is alert.     Coordination: Coordination normal.  Psychiatric:        Behavior: Behavior normal.     ED Results / Procedures / Treatments   Labs (all  labs ordered are listed, but only abnormal results are displayed) Labs Reviewed  CBC WITH DIFFERENTIAL/PLATELET - Abnormal; Notable for the following components:      Result Value   Abs Immature Granulocytes 0.08 (*)    All other components within normal limits  COMPREHENSIVE METABOLIC PANEL - Abnormal; Notable for the following components:   Glucose, Bld 175 (*)    BUN 33 (*)    Albumin 3.1 (*)    All other components within normal limits  URINALYSIS, ROUTINE W REFLEX MICROSCOPIC - Abnormal; Notable for the following components:   Glucose, UA >=500 (*)    All other components within normal limits    EKG None  Radiology No results found.  Procedures Procedures    Medications Ordered in ED Medications - No data to display  ED Course/ Medical Decision Making/ A&P                                 Medical Decision Making Amount and/or Complexity of Data Reviewed Labs: ordered.  Risk Prescription drug management.    This patient presents to the ED for concern of concern for worsening cellulitis, this involves an extensive number of treatment options, and is a complaint that carries with it a high risk of complications and morbidity.  The differential diagnosis includes chronic edema which is impairing his wound healing, thankfully there is no significant changes in the coloration of the skin, I do not think Dakota Odom has a raging cellulitis, Dakota Odom is not febrile or tachycardic and his blood pressure is 143/70.  Dakota Odom has severe edema which is likely impairing the timeframe of healing of the wounds and is not using compression stockings, Dakota Odom is diuresing and on cephalexin , I will check labs to make sure Dakota Odom does not have a rising  leukocytosis or any renal impairment from the furosemide  use.  We will also if him compression wraps and encourage compression stockings.  Dakota Odom will need close follow-up in the outpatient setting but Dakota Odom does not appear septic   Co morbidities that complicate the patient evaluation  Significant obesity, peripheral edema   Additional history obtained:  Additional history obtained from medical record External records from outside source obtained and reviewed including multiple visits in the outpatient setting, office visits for orthopedic, prior myocardial infarction followed by Dr. Wadie with cardiology, no recent admissions to the hospital since his heart attack in 2022 approximately 2 years ago   Lab Tests:  I Ordered, and personally interpreted labs.  The pertinent results include: No leukocytosis, metabolic panel okay, urinalysis okay, no significant spilling of protein, no liver dysfunction  Cardiac monitoring: Sinus rhythm    Problem List / ED Course / Critical interventions / Medication management  Wounds dressed, recommended compression stockings, changed to doxycycline , patient agreeable, no signs of sepsis, very well-appearing for discharge   Test / Admission - Considered:  Consider admission but no signs of significant cellulitis or sepsis, patient agreeable         Final Clinical Impression(s) / ED Diagnoses Final diagnoses:  Peripheral edema  Open wounds involving multiple regions of lower extremity    Rx / DC Orders ED Discharge Orders          Ordered    doxycycline  (VIBRAMYCIN ) 100 MG capsule  2 times daily        04/26/23 2034              Cleotilde Rogue,  MD 04/26/23 2035

## 2023-04-26 NOTE — ED Triage Notes (Addendum)
 Pt c/o rt leg swelling that is getting worse since his last visit. Pt has been seen multiple times with this same issue. Pt is being treated by PCP with keflex  and states he finishes his antibiotic tomorrow and is suppose to get another round started but does not feel it is helping. Pt is able to ambulate with a cane.

## 2023-05-07 ENCOUNTER — Other Ambulatory Visit: Payer: Self-pay | Admitting: Cardiovascular Disease

## 2023-06-18 ENCOUNTER — Other Ambulatory Visit (HOSPITAL_COMMUNITY): Payer: Self-pay | Admitting: Adult Health

## 2023-06-18 ENCOUNTER — Ambulatory Visit (HOSPITAL_COMMUNITY)
Admission: RE | Admit: 2023-06-18 | Discharge: 2023-06-18 | Disposition: A | Discharge status: Home / Self Care | Source: Ambulatory Visit | Attending: Adult Health | Admitting: Adult Health

## 2023-06-18 DIAGNOSIS — R0602 Shortness of breath: Secondary | ICD-10-CM | POA: Diagnosis present

## 2023-07-01 ENCOUNTER — Telehealth: Payer: Self-pay | Admitting: Cardiovascular Disease

## 2023-07-01 MED ORDER — ATORVASTATIN CALCIUM 80 MG PO TABS: 80.0000 mg | ORAL_TABLET | Freq: Every day | ORAL | 0 refills | Status: DC

## 2023-07-01 MED ORDER — METOPROLOL SUCCINATE ER 25 MG PO TB24: 25.0000 mg | ORAL_TABLET | Freq: Every day | ORAL | 0 refills | Status: DC

## 2023-07-01 MED ORDER — CLOPIDOGREL BISULFATE 75 MG PO TABS: 75.0000 mg | ORAL_TABLET | Freq: Every day | ORAL | 0 refills | Status: DC

## 2023-07-01 NOTE — Telephone Encounter (Signed)
*  STAT* If patient is at the pharmacy, call can be transferred to refill team.   1. Which medications need to be refilled? (please list name of each medication and dose if known) clopidogrel (PLAVIX) 75 MG tablet  metoprolol succinate (TOPROL-XL) 25 MG 24 hr tablet  atorvastatin (LIPITOR) 80 MG tablet  2. Which pharmacy/location (including street and city if local pharmacy) is medication to be sent to? [facility] - [place], [place] - [place] [phone]   3. Do they need a 30 day or 90 day supply? 90

## 2023-08-03 ENCOUNTER — Ambulatory Visit: Payer: BC Managed Care – PPO | Attending: Cardiovascular Disease | Admitting: Cardiovascular Disease

## 2023-08-03 ENCOUNTER — Encounter: Payer: Self-pay | Admitting: Cardiovascular Disease

## 2023-08-03 VITALS — BP 137/76 | HR 107 | Resp 16 | Ht 66.0 in | Wt 309.2 lb

## 2023-08-03 DIAGNOSIS — E785 Hyperlipidemia, unspecified: Secondary | ICD-10-CM | POA: Diagnosis not present

## 2023-08-03 DIAGNOSIS — I2111 ST elevation (STEMI) myocardial infarction involving right coronary artery: Secondary | ICD-10-CM

## 2023-08-03 DIAGNOSIS — I10 Essential (primary) hypertension: Secondary | ICD-10-CM | POA: Diagnosis not present

## 2023-08-03 DIAGNOSIS — Z720 Tobacco use: Secondary | ICD-10-CM

## 2023-08-03 NOTE — Assessment & Plan Note (Signed)
 History of essential hypertension with blood pressure measured today at 137/76.  He is on metoprolol .

## 2023-08-03 NOTE — Patient Instructions (Signed)
 Medication Instructions:  Your physician recommends that you continue on your current medications as directed. Please refer to the Current Medication list given to you today.  *If you need a refill on your cardiac medications before your next appointment, please call your pharmacy*   Follow-Up: At [facility], you and your health needs are our priority.  As part of our continuing mission to provide you with exceptional heart care, our providers are all part of one team.  This team includes your primary Cardiologist (physician) and Advanced Practice Providers or APPs (Physician Assistants and Nurse Practitioners) who all work together to provide you with the care you need, when you need it.  Your next appointment:   12 month(s)  Provider:   Nanetta Batty, MD     We recommend signing up for the patient portal called "MyChart".  Sign up information is provided on this After Visit Summary.  MyChart is used to connect with patients for Virtual Visits (Telemedicine).  Patients are able to view lab/test results, encounter notes, upcoming appointments, etc.  Non-urgent messages can be sent to your provider as well.   To learn more about what you can do with MyChart, go to [email or web address].   Other Instructions       1st Floor: - Lobby - Registration  - Pharmacy  - Lab - Cafe  2nd Floor: - PV Lab - Diagnostic Testing (echo, CT, nuclear med)  3rd Floor: - Vacant  4th Floor: - TCTS (cardiothoracic surgery) - AFib Clinic - Structural Heart Clinic - Vascular Surgery  - Vascular Ultrasound  5th Floor: - [facility] Cardiology (general and EP) - Clinical Pharmacy for coumadin, hypertension, lipid, weight-loss medications, and med management appointments    Valet parking services will be available as well.

## 2023-08-03 NOTE — Assessment & Plan Note (Signed)
 Morbid obesity with a BMI of 50.  He continues to gain weight every year that I see him.  He blames this on physical inactivity because of his legs and his back.

## 2023-08-03 NOTE — Assessment & Plan Note (Signed)
 History of CAD status post inferior STEMI 04/04/2021.  He drove himself to [facility] ER where he was found to have inferior ST segment elevation.  He was transported urgently to [facility] where I performed urgent coronary angiography via the right radial approach.  He did have moderate distal left main disease as well as ostial diagonal branch disease and an occluded dominant proximal RCA which I stented using 3 overlapping Medtronic frontier drug-eluting stents postdilated to 4 mm.  His EF was normal the following day.  He remains on dual antiplatelet therapy with aspirin  and clopidogrel .

## 2023-08-03 NOTE — Assessment & Plan Note (Signed)
 History of hyperlipidemia on high-dose statin therapy with lipid profile performed 06/25/2023 revealing total cholesterol 140, LDL 70 and HDL 45.

## 2023-08-03 NOTE — Assessment & Plan Note (Signed)
 Discontinue tobacco abuse at the time of his STEMI after having smoked 100 pack years.  He does have COPD.

## 2023-08-03 NOTE — Progress Notes (Signed)
 08/03/2023 Dakota Odom   1955/09/08  [id number]  Primary Physician Nsumanganyi, Golda Latch, NP Primary Cardiologist: Avanell Leigh MD Lathan Poke, [place], [place]  HPI:  Dakota Odom is a 68 y.o.    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  His primary care provider is Dr. Darliss Ek in [place] .  I last saw him in the office/16/24.  His cardiac risk factors are notable for over 100 pack years of tobacco abuse having chest stopped at the time of his myocardial infarction.  He has treated hypertension, hyperlipidemia and a strong family history for heart disease.  His father had CABG and his mother had myocardial infarction, his brother died as well.  He was awakened with chest pain on the evening of 04/04/2021 and drove himself to the [facility] ER where he was found to have inferior ST segment elevation.  He was transported urgently to [facility] where I performed urgent coronary angiography via the right radial approach.  He had moderate distal left main disease, diagonal branch ostial disease and an occluded proximal dominant RCA which I stented using 3 overlapping Medtronic frontier drug-eluting stents.  I postdilated the proximal portion with a 4 mm balloon.  His echo the following day revealed normal LV systolic function and he was discharged home on the 24th on dual antiplatelet therapy.  He is been asymptomatic since.  He did stop smoking at the time of his myocardial infarction.  Since I saw him a year ago he continues to do well.  He has unfortunately gained 15 pounds as I last saw him.  He was transition from Brilinta  to clopidogrel .  He denies chest pain or shortness of breath.  He has developed cellulitis in both legs and has been treated at the wound care center in [place].  He is fairly sedentary and basically does not get outside of the house.   Current Meds  Medication Sig    albuterol (VENTOLIN HFA) 108 (90 Base) MCG/ACT inhaler Inhale 2 puffs into the lungs every 4 (four) hours as needed.   aspirin  EC 81 MG tablet Take 1 tablet (81 mg total) by mouth daily.   atorvastatin  (LIPITOR ) 80 MG tablet Take 1 tablet (80 mg total) by mouth daily.   cetirizine (ZYRTEC) 10 MG tablet Take 10 mg by mouth daily.   clopidogrel  (PLAVIX ) 75 MG tablet Take 1 tablet (75 mg total) by mouth daily.   cyanocobalamin (VITAMIN B12) 1000 MCG tablet Take 2 tablets by mouth daily.   doxycycline  (VIBRAMYCIN ) 100 MG capsule Take 1 capsule (100 mg total) by mouth 2 (two) times daily.   empagliflozin  (JARDIANCE ) 10 MG TABS tablet TAKE (1) TABLET BY MOUTH DAILY   ferrous sulfate  325 (65 FE) MG tablet Take 1 tablet (325 mg total) by mouth daily.   fluticasone-salmeterol (ADVAIR) 250-50 MCG/ACT AEPB Inhale 1 puff into the lungs in the morning and at bedtime.   furosemide  (LASIX ) 40 MG tablet Take 1 tablet (40 mg total) by mouth daily.   metFORMIN (GLUCOPHAGE) 500 MG tablet Take 500 mg by mouth 2 (two) times daily.   metoprolol  succinate (TOPROL -XL) 25 MG 24 hr tablet Take 1 tablet (25 mg total) by mouth daily.   Multiple Vitamin (MULTIVITAMIN WITH MINERALS) TABS tablet Take 1 tablet by mouth daily.   naproxen (NAPROSYN) 500 MG tablet Take 500 mg by mouth 2 (two) times daily with a meal.  nitroGLYCERIN  (NITROSTAT ) 0.4 MG SL tablet Place 1 tablet (0.4 mg total) under the tongue every 5 (five) minutes x 3 doses as needed for chest pain.   potassium chloride  SA (KLOR-CON  M) 20 MEQ tablet Take 1 daily for three days then as needed thereafter (Patient taking differently: 20 mEq daily. Take 1 daily for three days then as needed thereafter)   terbinafine (LAMISIL) 250 MG tablet Take 250 mg by mouth daily.     No Known Allergies  Social History   Socioeconomic History   Marital status: Married    Spouse name: Not on file   Number of children: Not on file   Years of education: Not on file   Highest  education level: Not on file  Occupational History   Not on file  Tobacco Use   Smoking status: Former    Current packs/day: 0.00    Average packs/day: 0.5 packs/day for 20.0 years (10.0 ttl pk-yrs)    Types: Cigarettes    Start date: 04/05/2001    Quit date: 04/05/2021    Years since quitting: 2.3   Smokeless tobacco: Never  Vaping Use   Vaping status: Never Used  Substance and Sexual Activity   Alcohol use: Not Currently   Drug use: Never   Sexual activity: Not on file  Other Topics Concern   Not on file  Social History Narrative   Not on file   Social Drivers of Health   Financial Resource Strain: Not on file  Food Insecurity: Not on file  Transportation Needs: Not on file  Physical Activity: Not on file  Stress: Not on file  Social Connections: Not on file  Intimate Partner Violence: Not on file     Review of Systems: General: negative for chills, fever, night sweats or weight changes.  Cardiovascular: negative for chest pain, dyspnea on exertion, edema, orthopnea, palpitations, paroxysmal nocturnal dyspnea or shortness of breath Dermatological: negative for rash Respiratory: negative for cough or wheezing Urologic: negative for hematuria Abdominal: negative for nausea, vomiting, diarrhea, bright red blood per rectum, melena, or hematemesis Neurologic: negative for visual changes, syncope, or dizziness All other systems reviewed and are otherwise negative except as noted above.    Blood pressure 137/76, pulse (!) 107, resp. rate 16, height 5\' 6"  (1.676 m), weight (!) 309 lb 3.2 oz (140.3 kg), SpO2 95%.  General appearance: alert and no distress Neck: no adenopathy, no carotid bruit, no JVD, supple, symmetrical, trachea midline, and thyroid not enlarged, symmetric, no tenderness/mass/nodules Lungs: clear to auscultation bilaterally Heart: regular rate and rhythm, S1, S2 normal, no murmur, click, rub or gallop Extremities: extremities normal, atraumatic, no  cyanosis or edema Pulses: 2+ and symmetric Skin: Skin color, texture, turgor normal. No rashes or lesions Neurologic: Grossly normal  EKG EKG Interpretation Date/Time:  Monday August 03 2023 15:00:30 EDT Ventricular Rate:  104 PR Interval:  238 QRS Duration:  92 QT Interval:  328 QTC Calculation: 431 R Axis:   2  Text Interpretation: Sinus tachycardia with 1st degree A-V block When compared with ECG of 06-Apr-2021 03:44, Premature supraventricular complexes are no longer Present PR interval has increased Criteria for Inferior infarct are no longer Present T wave inversion no longer evident in Inferior leads Nonspecific T wave abnormality, improved in Lateral leads QT has shortened Confirmed by Lauro Portal [id number]) on 08/03/2023 3:20:52 PM    ASSESSMENT AND PLAN:   STEMI involving right coronary artery (HCC) History of CAD status post inferior STEMI 04/04/2021.  He drove  himself to [facility] ER where he was found to have inferior ST segment elevation.  He was transported urgently to [facility] where I performed urgent coronary angiography via the right radial approach.  He did have moderate distal left main disease as well as ostial diagonal branch disease and an occluded dominant proximal RCA which I stented using 3 overlapping Medtronic frontier drug-eluting stents postdilated to 4 mm.  His EF was normal the following day.  He remains on dual antiplatelet therapy with aspirin  and clopidogrel .  Hyperlipidemia LDL goal <70 History of hyperlipidemia on high-dose statin therapy with lipid profile performed 06/25/2023 revealing total cholesterol 140, LDL 70 and HDL 45.  Hypertension History of essential hypertension with blood pressure measured today at 137/76.  He is on metoprolol .  Tobacco abuse Discontinue tobacco abuse at the time of his STEMI after having smoked 100 pack years.  He does have COPD.  Morbid obesity (HCC) Morbid obesity with a BMI of 50.  He continues  to gain weight every year that I see him.  He blames this on physical inactivity because of his legs and his back.     Avanell Leigh MD FACP,FACC,FAHA, [facility] 08/03/2023 3:34 PM

## 2023-09-19 ENCOUNTER — Other Ambulatory Visit: Payer: Self-pay | Admitting: Cardiovascular Disease

## 2023-09-21 ENCOUNTER — Other Ambulatory Visit (HOSPITAL_BASED_OUTPATIENT_CLINIC_OR_DEPARTMENT_OTHER): Payer: Self-pay

## 2023-10-01 ENCOUNTER — Ambulatory Visit
Admission: EM | Admit: 2023-10-01 | Discharge: 2023-10-01 | Disposition: A | Discharge status: Home / Self Care | Attending: Nurse Practitioner | Admitting: Nurse Practitioner

## 2023-10-01 DIAGNOSIS — S80861A Insect bite (nonvenomous), right lower leg, initial encounter: Secondary | ICD-10-CM

## 2023-10-01 DIAGNOSIS — R21 Rash and other nonspecific skin eruption: Secondary | ICD-10-CM | POA: Diagnosis not present

## 2023-10-01 DIAGNOSIS — W57XXXA Bitten or stung by nonvenomous insect and other nonvenomous arthropods, initial encounter: Secondary | ICD-10-CM

## 2023-10-01 MED ORDER — DOXYCYCLINE HYCLATE 100 MG PO CAPS: 200.0000 mg | ORAL_CAPSULE | Freq: Once | ORAL | 0 refills | Status: AC

## 2023-10-01 NOTE — ED Triage Notes (Signed)
 Pt reports being bitten by tick states he was at wound care having wrap replaced on his leg, when they undid the wrap they noticed redness and swelling in the front of the leg and when they looked closer there was a tick. They were able to get the whole tick. Pt has brought the tick with him.

## 2023-10-01 NOTE — ED Provider Notes (Signed)
 [facility]    CSN: [id number] Arrival date & time: 10/01/23  1317      History   Chief Complaint No chief complaint on file.   HPI Dakota Odom is a 68 y.o. male.   Patient presents today with concern for tick bite.  Reports he has lymphedema and gets dressings changed weekly by a wound care RN.  When the RN removed the dressings today, she noticed a rash with a tick in the center under the dressing on the right leg.  The tick was removed entirely. Patient was instructed to be evaluated.  He does not know how long the tick was attached to his skin; dressings were last changed 1 week ago.  He denies fever, headache, body/muscle aches, chills, neck stiffness.  He has the tick with him today as well as pictures of the rash that the wound care nurse took on the patient's cell phone.    Past Medical History:  Diagnosis Date   Anemia    new in 2023   CAD (coronary artery disease)    Chronic heart failure with preserved ejection fraction (HFpEF) (HCC)    Fungal infection of foot    Hypertension    MI (myocardial infarction) (HCC)    Morbid obesity (HCC)    Pre-diabetes     Patient Active Problem List   Diagnosis Date Noted   Bilateral carpal tunnel syndrome 12/10/2021   Morbid obesity (HCC) 04/24/2021   Hyperlipidemia LDL goal <70 04/06/2021   Prediabetes 04/06/2021   Hypertension 04/06/2021   Tobacco abuse 04/06/2021   STEMI involving right coronary artery (HCC) 04/04/2021    Past Surgical History:  Procedure Laterality Date   CORONARY/GRAFT ACUTE MI REVASCULARIZATION N/A 04/04/2021   Procedure: Coronary/Graft Acute MI Revascularization;  Surgeon: Avanell Leigh, MD;  Location: [facility] INVASIVE CV [facility];  Service: Cardiovascular;  Laterality: N/A;   LEFT HEART CATH AND CORONARY ANGIOGRAPHY N/A 04/04/2021   Procedure: LEFT HEART CATH AND CORONARY ANGIOGRAPHY;  Surgeon: Avanell Leigh, MD;  Location: [facility]VAS[facility] CV [facility];  Service: Cardiovascular;  Laterality: N/A;        Home Medications    Prior to Admission medications   Medication Sig Start Date End Date Taking? Authorizing Provider  doxycycline  (VIBRAMYCIN ) 100 MG capsule Take 2 capsules (200 mg total) by mouth once for 1 dose. 10/01/23 10/01/23 Yes Wilhemena Harbour, NP  albuterol (VENTOLIN HFA) 108 (90 Base) MCG/ACT inhaler Inhale 2 puffs into the lungs every 4 (four) hours as needed.    [provider]  aspirin  EC 81 MG tablet Take 1 tablet (81 mg total) by mouth daily. 04/06/21   Meng, Hao, PA  atorvastatin  (LIPITOR ) 80 MG tablet Take 1 tablet (80 mg total) by mouth daily. 09/21/23   Avanell Leigh, MD  cetirizine (ZYRTEC) 10 MG tablet Take 10 mg by mouth daily.    [provider]  clopidogrel  (PLAVIX ) 75 MG tablet Take 1 tablet (75 mg total) by mouth daily. 07/01/23   Avanell Leigh, MD  cyanocobalamin (VITAMIN B12) 1000 MCG tablet Take 2 tablets by mouth daily.    [provider]  empagliflozin  (JARDIANCE ) 10 MG TABS tablet TAKE (1) TABLET BY MOUTH DAILY 04/22/23   Avanell Leigh, MD  ferrous sulfate  325 (65 FE) MG tablet Take 1 tablet (325 mg total) by mouth daily. 02/26/22   Teddi Favors, DO  fluticasone-salmeterol (ADVAIR) 250-50 MCG/ACT AEPB Inhale 1 puff into the lungs in the morning and  at bedtime.    [provider]  furosemide  (LASIX ) 40 MG tablet Take 1 tablet (40 mg total) by mouth daily. 04/21/23   Alissa April, MD  metFORMIN (GLUCOPHAGE) 500 MG tablet Take 500 mg by mouth 2 (two) times daily. 03/31/23   [provider]  metoprolol  succinate (TOPROL -XL) 25 MG 24 hr tablet Take 1 tablet (25 mg total) by mouth daily. 07/01/23   Avanell Leigh, MD  Multiple Vitamin (MULTIVITAMIN WITH MINERALS) TABS tablet Take 1 tablet by mouth daily.    [provider]  naproxen (NAPROSYN) 500 MG tablet Take 500 mg by mouth 2 (two) times daily with a meal. 03/09/23   [provider]  nitroGLYCERIN  (NITROSTAT ) 0.4 MG SL tablet Place  1 tablet (0.4 mg total) under the tongue every 5 (five) minutes x 3 doses as needed for chest pain. 08/07/22   Avanell Leigh, MD  potassium chloride  SA (KLOR-CON  M) 20 MEQ tablet Take 1 daily for three days then as needed thereafter Patient taking differently: 20 mEq daily. Take 1 daily for three days then as needed thereafter 02/27/22   Dunn, Dayna N, PA-C  terbinafine (LAMISIL) 250 MG tablet Take 250 mg by mouth daily. 01/19/23   [provider]    Family History Family History  Problem Relation Age of Onset   Diabetes Mother    Other Father        malaria   Heart failure Father    CAD Father    Diabetes Father     Social History Social History   Tobacco Use   Smoking status: Former    Current packs/day: 0.00    Average packs/day: 0.5 packs/day for 20.0 years (10.0 ttl pk-yrs)    Types: Cigarettes    Start date: 04/05/2001    Quit date: 04/05/2021    Years since quitting: 2.4   Smokeless tobacco: Never  Vaping Use   Vaping status: Never Used  Substance Use Topics   Alcohol use: Not Currently   Drug use: Never     Allergies   Patient has no known allergies.   Review of Systems Review of Systems Per HPI  Physical Exam Triage Vital Signs ED Triage Vitals  Encounter Vitals Group     BP 10/01/23 1323 118/69     Girls Systolic BP Percentile --      Girls Diastolic BP Percentile --      Boys Systolic BP Percentile --      Boys Diastolic BP Percentile --      Pulse Rate 10/01/23 1323 96     Resp 10/01/23 1323 18     Temp 10/01/23 1323 97.8 F (36.6 C)     Temp Source 10/01/23 1323 Oral     SpO2 10/01/23 1323 90 %     Weight --      Height --      Head Circumference --      Peak Flow --      Pain Score 10/01/23 1328 0     Pain Loc --      Pain Education --      Exclude from Growth Chart --    No data found.  Updated Vital Signs BP 118/69 (BP Location: Right Arm)   Pulse 96   Temp 97.8 F (36.6 C) (Oral)   Resp 18   SpO2 90%   Visual  Acuity Right Eye Distance:   Left Eye Distance:   Bilateral Distance:    Right Eye  Near:   Left Eye Near:    Bilateral Near:     Physical Exam Vitals and nursing note reviewed.  Constitutional:      General: He is not in acute distress.    Appearance: Normal appearance. He is not toxic-appearing.  HENT:     Head: Normocephalic and atraumatic.     Mouth/Throat:     Mouth: Mucous membranes are moist.     Pharynx: Oropharynx is clear.   Cardiovascular:     Rate and Rhythm: Normal rate.  Pulmonary:     Effort: Pulmonary effort is normal. No respiratory distress.   Skin:    General: Skin is warm and dry.     Capillary Refill: Capillary refill takes less than 2 seconds.     Coloration: Skin is not jaundiced or pale.     Comments: Bilateral lower extremities covered with dressings with wound care; not removed for examination as they were just replaced today and patient was able to provide photographs of rash; see below   Neurological:     Mental Status: He is alert and oriented to person, place, and time.   Psychiatric:        Behavior: Behavior is cooperative.             UC Treatments / Results  Labs (all labs ordered are listed, but only abnormal results are displayed) Labs Reviewed  LYME DISEASE SEROLOGY W/REFLEX  SPOTTED FEVER GROUP ANTIBODIES    EKG   Radiology No results found.  Procedures Procedures (including critical care time)  Medications Ordered in UC Medications - No data to display  Initial Impression / Assessment and Plan / UC Course  I have reviewed the triage vital signs and the nursing notes.  Pertinent labs & imaging results that were available during my care of the patient were reviewed by me and considered in my medical decision making (see chart for details).   Patient is well-appearing, normotensive, afebrile, not tachycardic, not tachypneic, oxygenating well on room air.   1. Rash and nonspecific skin eruption 2. Tick bite  of right lower leg, initial encounter Tick appears to be a [place] Tick; rash is not consistent with erythema migrans Will treat with doxycycline  200 mg once while serology is pending We discussed signs and symptoms to watch out for in the next couple of weeks   The patient was given the opportunity to ask questions.  All questions answered to their satisfaction.  The patient is in agreement to this plan.   Final Clinical Impressions(s) / UC Diagnoses   Final diagnoses:  Rash and nonspecific skin eruption  Tick bite of right lower leg, initial encounter     Discharge Instructions      We are checking your blood work today for tickborne illness.  Will contact you with abnormal results.  In the meantime, take the doxycycline  200 mg today.  If you develop fever, new muscle pain or joint aches, chills, headache, neck stiffness, please seek care emergently.   ED Prescriptions     Medication Sig Dispense Auth. Provider   doxycycline  (VIBRAMYCIN ) 100 MG capsule Take 2 capsules (200 mg total) by mouth once for 1 dose. 2 capsule Wilhemena Harbour, NP      PDMP not reviewed this encounter.   Wilhemena Harbour, NP 10/01/23 1550

## 2023-10-01 NOTE — Discharge Instructions (Signed)
 We are checking your blood work today for tickborne illness.  Will contact you with abnormal results.  In the meantime, take the doxycycline  200 mg today.  If you develop fever, new muscle pain or joint aches, chills, headache, neck stiffness, please seek care emergently.

## 2023-10-06 LAB — SPOTTED FEVER GROUP ANTIBODIES
Spotted Fever Group IgG: <1:64 {titer}
Spotted Fever Group IgM: <1:64 {titer}

## 2023-10-06 LAB — LYME DISEASE SEROLOGY W/REFLEX: Lyme Total Antibody EIA: NEGATIVE

## 2023-10-15 ENCOUNTER — Telehealth: Payer: Self-pay | Admitting: Cardiovascular Disease

## 2023-10-15 NOTE — Telephone Encounter (Signed)
 Called patient back about message. Patient is at lymphedema OT right now. Informed patient that we could have him come in for an evaluation to see if he needs these referrals, or if this his symptoms are cardiac related. Patient would like a call back in one hour with appointment information. Patient seeing Jackee Alberts NP on 10/22/23 at 10:30 am.

## 2023-10-15 NOTE — Telephone Encounter (Signed)
 Pt c/o Shortness Of Breath: STAT if SOB developed within the last 24 hours or pt is noticeably SOB on the phone  1. Are you currently SOB (can you hear that pt is SOB on the phone)?  Yes  2. How long have you been experiencing SOB?  Unknown  3. Are you SOB when sitting or when up moving around?  Both  4. Are you currently experiencing any other symptoms?  None noted   Caller Lessie) stated she see saw patient today for wound check and noted patient has been having SOB.  Caller stated patient's oxygen was 92%, 22-28 rpm, HR 103-120 (going up and down), BP 160/90.  Caller noted her readings showed patient was in afib.  Caller wants patient contacted directly regarding getting a referral to pulmonology and a home sleep study.

## 2023-10-17 ENCOUNTER — Other Ambulatory Visit: Payer: Self-pay | Admitting: Cardiovascular Disease

## 2023-10-21 NOTE — Progress Notes (Signed)
 " Cardiology Office Note    Patient Name: Dakota Odom Date of Encounter: 10/21/2023  Primary Care Provider:  Benjamin Raina Elizabeth, NP Primary Cardiologist:  Dorn Lesches, MD Primary Electrophysiologist: None   Past Medical History    Past Medical History:  Diagnosis Date   Anemia    new in 2023   CAD (coronary artery disease)    Chronic heart failure with preserved ejection fraction (HFpEF) (HCC)    Fungal infection of foot    Hypertension    MI (myocardial infarction) (HCC)    Morbid obesity (HCC)    Pre-diabetes     History of Present Illness  Dakota Odom is a 68 y.o. male with a PMH of CAD s/p inferior STEMI with PCI/DES to RCA, HTN,Diabetes types obesity, tobacco abuse, HLD who presents today with complaint of shortness of breath with exertion.  Dakota Odom was seen initially in 03/2021 for complaint of chest pain and found to have inferior STEMI with occluded RCA that was treated with 3 overlapping DES. He had otherwise 70% distal LM without significant disease in LAD/LCx, recommended for medical therapy. 2D echo 03/2021 EF 60-65%, moderate LVH, normal RV, aortic sclerosis without stenosis.  He was seen by Dakota Bring, PA on 02/2022 with complaint of persistent DOE and elevated BP.  He was switched from Brilinta  to Plavix  and was treated with a short course of Lasix .  He was last seen by Dakota Odom on 08/03/2023 and reported doing well with no acute coronary complaints.  He is currently being treated for lymphedema of both legs Occupational Therapy.  He contacted our office on 10/15/2023 with complaint of shortness of breath with exertion. He experiences shortness of breath with minimal exertion, such as walking 30-40 feet, which necessitates the use of a rescue inhaler. The inhaler provides relief after about two minutes. No chest tightness or heaviness similar to his previous heart attack, and no palpitations or skipped beats. He finds it difficult to breathe in hot weather and  avoids going outside when it is hot.  He has a significant smoking history, having smoked for about 50 years before quitting in December 2022 after his heart attack. He has a history of an inferior myocardial infarction in 2022, for which he had three stents placed. An echocardiogram at that time showed normal heart function. He was previously on Brilinta  but was switched to Plavix  due to shortness of breath. A short course of Lasix  was prescribed, which did not significantly improve his symptoms. He is currently taking 20 mg of Lasix  as needed for increased shortness of breath. He has lymphedema in both legs and a history of cellulitis, which was treated with antibiotics. The cellulitis has been slow to heal, taking about six months. Compression therapy for his lymphedema has helped reduce swelling. He was diagnosed with type 2 diabetes two years ago and is currently taking 850 mg of metformin twice a day and Jardiance . His last hemoglobin A1c was 6.3. He reports a family history of diabetes on both sides. Dakota Odom presents today for complaint of shortness of breath with exertion.  Patient denies chest pain, palpitations,  PND, orthopnea, nausea, vomiting, dizziness, syncope, edema, weight gain, or early satiety.  Discussed the use of AI scribe software for clinical note transcription with the patient, who gave verbal consent to proceed.  History of Present Illness   Review of Systems  Please see the history of present illness.    All other systems reviewed and are otherwise  negative except as noted above.  Physical Exam    Wt Readings from Last 3 Encounters:  08/03/23 (!) 309 lb 3.2 oz (140.3 kg)  04/26/23 (!) 300 lb (136.1 kg)  04/20/23 300 lb (136.1 kg)   CD:Uyzmz were no vitals filed for this visit.,There is no height or weight on file to calculate BMI. GEN: Well nourished, well developed in no acute distress Neck: No JVD; No carotid bruits Pulmonary: Clear to auscultation with wheezing  bilaterally Cardiovascular: Normal rate. Regular rhythm. Normal S1. Normal S2.   Murmurs: There is no murmur.  ABDOMEN: Soft, non-tender, non-distended EXTREMITIES: Bilateral anasarca and weeping in lower extremities  EKG/LABS/ Recent Cardiac Studies   ECG personally reviewed by me today -none completed today  Risk Assessment/Calculations:          Lab Results  Component Value Date   WBC 8.1 04/26/2023   HGB 13.3 04/26/2023   HCT 42.4 04/26/2023   MCV 84.1 04/26/2023   PLT 318 04/26/2023   Lab Results  Component Value Date   CREATININE 1.10 04/26/2023   BUN 33 (H) 04/26/2023   NA 136 04/26/2023   K 4.2 04/26/2023   CL 105 04/26/2023   CO2 24 04/26/2023   Lab Results  Component Value Date   CHOL 128 08/01/2021   HDL 44 08/01/2021   LDLCALC 66 08/01/2021   TRIG 93 08/01/2021   CHOLHDL 2.9 08/01/2021    Lab Results  Component Value Date   HGBA1C 6.3 (H) 04/04/2021   Assessment & Plan    Assessment & Plan   1.  Coronary artery disease: -s/p  inferior STEMI with PCI/DES to RCA in 03/2021 with no complaints of chest pain or angina - Patient endorses increased shortness of breath with exertion which was one of his anginal equivalents - Order echocardiogram. - Order Lexiscan  stress test. - Continue current GDMT with ASA 81 mg, Lipitor  80 mg, Plavix  75 mg, Toprol -XL 25 mg  2.  Shortness of breath: -Chronic exertional dyspnea with differential including cardiac and pulmonary causes. History of smoking and cardiac disease. Previous echocardiogram normal. Dyspnea not improved with Lasix  40 mg. - Order echocardiogram. - Refer to pulmonologist.  3.  Essential hypertension: - Patient's blood pressure today was 122/70 - Continue Toprol -XL 25 mg daily  4.  Hyperlipidemia: - Patient's last LDL cholesterol was 66 - Continue Lipitor  80 mg daily  5.  Type 2 diabetes: - managed with metformin and Jardiance . Blood sugar control crucial due to slow wound healing. -  Continue metformin 850 mg twice daily. - Continue Jardiance . - Monitor blood sugar levels  6. Cellulitis: Cellulitis in context of lymphedema. Previously treated with antibiotics. Slow healing possibly related to diabetes.     Disposition: Follow-up with Dorn Lesches, MD or APP in 3 months Informed Consent   Shared Decision Making/Informed Consent The risks [chest pain, shortness of breath, cardiac arrhythmias, dizziness, blood pressure fluctuations, myocardial infarction, stroke/transient ischemic attack, nausea, vomiting, allergic reaction, radiation exposure, metallic taste sensation and life-threatening complications (estimated to be 1 in 10,000)], benefits (risk stratification, diagnosing coronary artery disease, treatment guidance) and alternatives of a nuclear stress test were discussed in detail with Mr. Duval and he agrees to proceed.      Signed, Wyn Raddle, Jackee Shove, NP 10/21/2023, 1:20 PM [facility] "

## 2023-10-22 ENCOUNTER — Encounter: Payer: Self-pay | Admitting: Nurse Practitioner

## 2023-10-22 ENCOUNTER — Ambulatory Visit: Attending: Nurse Practitioner | Admitting: Nurse Practitioner

## 2023-10-22 VITALS — BP 122/70 | HR 99 | Ht 66.0 in | Wt 303.4 lb

## 2023-10-22 DIAGNOSIS — I10 Essential (primary) hypertension: Secondary | ICD-10-CM | POA: Diagnosis not present

## 2023-10-22 DIAGNOSIS — E785 Hyperlipidemia, unspecified: Secondary | ICD-10-CM | POA: Diagnosis not present

## 2023-10-22 DIAGNOSIS — I2510 Atherosclerotic heart disease of native coronary artery without angina pectoris: Secondary | ICD-10-CM | POA: Diagnosis not present

## 2023-10-22 DIAGNOSIS — R0609 Other forms of dyspnea: Secondary | ICD-10-CM

## 2023-10-22 DIAGNOSIS — I890 Lymphedema, not elsewhere classified: Secondary | ICD-10-CM

## 2023-10-22 NOTE — Patient Instructions (Signed)
 Medication Instructions:  Your physician recommends that you continue on your current medications as directed. Please refer to the Current Medication list given to you today.  *If you need a refill on your cardiac medications before your next appointment, please call your pharmacy*  Lab Work: TODAY-BMET & BNP If you have labs (blood work) drawn today and your tests are completely normal, you will receive your results only by: MyChart Message (if you have MyChart) OR A paper copy in the mail If you have any lab test that is abnormal or we need to change your treatment, we will call you to review the results.  Testing/Procedures: Your physician has requested that you have a lexiscan myoview. For further information please visit [email or web address] Please follow instruction sheet, as given.  Your physician has requested that you have an echocardiogram. Echocardiography is a painless test that uses sound waves to create images of your heart. It provides your doctor with information about the size and shape of your heart and how well your heart's chambers and valves are working. This procedure takes approximately one hour. There are no restrictions for this procedure. Please do NOT wear cologne, perfume, aftershave, or lotions (deodorant is allowed). Please arrive 15 minutes prior to your appointment time.  Please note: We ask at that you not bring children with you during ultrasound (echo/ vascular) testing. Due to room size and safety concerns, children are not allowed in the ultrasound rooms during exams. Our front office staff cannot provide observation of children in our lobby area while testing is being conducted. An adult accompanying a patient to their appointment will only be allowed in the ultrasound room at the discretion of the ultrasound technician under special circumstances. We apologize for any inconvenience.   Follow-Up: At [facility], you and your health needs are our  priority.  As part of our continuing mission to provide you with exceptional heart care, our providers are all part of one team.  This team includes your primary Cardiologist (physician) and Advanced Practice Providers or APPs (Physician Assistants and Nurse Practitioners) who all work together to provide you with the care you need, when you need it.  Your next appointment:   3 month(s)  Provider:   Dayna Dunn, PA-C        We recommend signing up for the patient portal called MyChart.  Sign up information is provided on this After Visit Summary.  MyChart is used to connect with patients for Virtual Visits (Telemedicine).  Patients are able to view lab/test results, encounter notes, upcoming appointments, etc.  Non-urgent messages can be sent to your provider as well.   To learn more about what you can do with MyChart, go to [email or web address].   Other Instructions You have been referred to PULMONOLOGY.

## 2023-10-23 ENCOUNTER — Ambulatory Visit: Payer: Self-pay | Admitting: Nurse Practitioner

## 2023-10-23 LAB — BASIC METABOLIC PANEL WITH GFR
BUN/Creatinine Ratio: 14 (ref 10–24)
BUN: 14 mg/dL (ref 8–27)
CO2: 21 mmol/L (ref 20–29)
Calcium: 9.2 mg/dL (ref 8.6–10.2)
Chloride: 103 mmol/L (ref 96–106)
Creatinine, Ser: 1.01 mg/dL (ref 0.76–1.27)
Glucose: 86 mg/dL (ref 70–99)
Potassium: 4.4 mmol/L (ref 3.5–5.2)
Sodium: 143 mmol/L (ref 134–144)
eGFR: 81 mL/min/1.73 (ref >59)

## 2023-10-23 LAB — PRO B NATRIURETIC PEPTIDE: NT-Pro BNP: 98 pg/mL (ref 0–376)

## 2023-10-27 ENCOUNTER — Telehealth (HOSPITAL_COMMUNITY): Payer: Self-pay

## 2023-10-27 ENCOUNTER — Other Ambulatory Visit: Payer: Self-pay | Admitting: Nurse Practitioner

## 2023-10-27 DIAGNOSIS — I2510 Atherosclerotic heart disease of native coronary artery without angina pectoris: Secondary | ICD-10-CM

## 2023-10-27 DIAGNOSIS — E785 Hyperlipidemia, unspecified: Secondary | ICD-10-CM

## 2023-10-27 DIAGNOSIS — R0609 Other forms of dyspnea: Secondary | ICD-10-CM

## 2023-10-27 DIAGNOSIS — I10 Essential (primary) hypertension: Secondary | ICD-10-CM

## 2023-10-27 DIAGNOSIS — I890 Lymphedema, not elsewhere classified: Secondary | ICD-10-CM

## 2023-10-27 NOTE — Telephone Encounter (Signed)
 Spoke with the patient, detailed instructions given. S.Icela Glymph CCT

## 2023-10-30 ENCOUNTER — Ambulatory Visit (HOSPITAL_COMMUNITY)
Admission: RE | Admit: 2023-10-30 | Discharge: 2023-10-30 | Disposition: A | Discharge status: Home / Self Care | Source: Ambulatory Visit | Attending: Cardiology | Admitting: Cardiology

## 2023-10-30 DIAGNOSIS — I2510 Atherosclerotic heart disease of native coronary artery without angina pectoris: Secondary | ICD-10-CM | POA: Insufficient documentation

## 2023-10-30 DIAGNOSIS — R0609 Other forms of dyspnea: Secondary | ICD-10-CM | POA: Insufficient documentation

## 2023-10-30 DIAGNOSIS — I890 Lymphedema, not elsewhere classified: Secondary | ICD-10-CM | POA: Insufficient documentation

## 2023-10-30 DIAGNOSIS — I10 Essential (primary) hypertension: Secondary | ICD-10-CM | POA: Insufficient documentation

## 2023-10-30 DIAGNOSIS — E785 Hyperlipidemia, unspecified: Secondary | ICD-10-CM | POA: Insufficient documentation

## 2023-10-30 LAB — MYOCARDIAL PERFUSION IMAGING
Base ST Depression (mm): 0 mm
LV dias vol: 149 mL (ref 62–150)
LV sys vol: 53 mL (ref 4.2–5.8)
Nuc Stress EF: 64 %
Peak HR: 106 {beats}/min
Rest HR: 95 {beats}/min
Rest Nuclear Isotope Dose: 12.1 mCi
SDS: 0
SRS: 7
SSS: 2
ST Depression (mm): 0 mm
Stress Nuclear Isotope Dose: 37 mCi
TID: 0.96

## 2023-10-30 MED ORDER — REGADENOSON 0.4 MG/5ML IV SOLN
0.4000 mg | Freq: Once | INTRAVENOUS | Status: AC
  Administered 2023-10-30: 0.4 mg via INTRAVENOUS

## 2023-10-30 MED ORDER — TECHNETIUM TC 99M TETROFOSMIN IV KIT
37.0000 | PACK | Freq: Once | INTRAVENOUS | Status: AC | PRN
  Administered 2023-10-30: 37 via INTRAVENOUS

## 2023-10-30 MED ORDER — TECHNETIUM TC 99M TETROFOSMIN IV KIT
12.1000 | PACK | Freq: Once | INTRAVENOUS | Status: AC | PRN
  Administered 2023-10-30: 12.1 via INTRAVENOUS

## 2023-10-30 MED ORDER — REGADENOSON 0.4 MG/5ML IV SOLN
INTRAVENOUS | Status: AC
  Filled 2023-10-30: qty 5

## 2023-11-12 ENCOUNTER — Other Ambulatory Visit: Payer: Self-pay | Admitting: Cardiovascular Disease

## 2023-11-30 ENCOUNTER — Ambulatory Visit (HOSPITAL_COMMUNITY)
Admission: RE | Admit: 2023-11-30 | Discharge: 2023-11-30 | Disposition: A | Discharge status: Home / Self Care | Source: Ambulatory Visit | Attending: Nurse Practitioner | Admitting: Nurse Practitioner

## 2023-11-30 DIAGNOSIS — E785 Hyperlipidemia, unspecified: Secondary | ICD-10-CM | POA: Diagnosis present

## 2023-11-30 DIAGNOSIS — I890 Lymphedema, not elsewhere classified: Secondary | ICD-10-CM | POA: Insufficient documentation

## 2023-11-30 DIAGNOSIS — R0609 Other forms of dyspnea: Secondary | ICD-10-CM | POA: Insufficient documentation

## 2023-11-30 DIAGNOSIS — I2510 Atherosclerotic heart disease of native coronary artery without angina pectoris: Secondary | ICD-10-CM | POA: Insufficient documentation

## 2023-11-30 DIAGNOSIS — I10 Essential (primary) hypertension: Secondary | ICD-10-CM | POA: Insufficient documentation

## 2023-11-30 LAB — ECHOCARDIOGRAM COMPLETE
Area-P 1/2: 4.68 cm2
S' Lateral: 3.64 cm

## 2023-12-04 ENCOUNTER — Encounter: Payer: Self-pay | Admitting: Radiology

## 2023-12-10 ENCOUNTER — Ambulatory Visit (INDEPENDENT_AMBULATORY_CARE_PROVIDER_SITE_OTHER): Admitting: Internal Medicine

## 2023-12-10 ENCOUNTER — Encounter: Payer: Self-pay | Admitting: Internal Medicine

## 2023-12-10 VITALS — BP 122/72 | HR 103 | Ht 66.0 in | Wt 302.2 lb

## 2023-12-10 DIAGNOSIS — R0902 Hypoxemia: Secondary | ICD-10-CM

## 2023-12-10 DIAGNOSIS — R0609 Other forms of dyspnea: Secondary | ICD-10-CM | POA: Diagnosis not present

## 2023-12-10 DIAGNOSIS — Z6841 Body Mass Index (BMI) 40.0 and over, adult: Secondary | ICD-10-CM | POA: Diagnosis not present

## 2023-12-10 NOTE — Patient Instructions (Addendum)
 To get the most out of exercise, you need to be continuously aware that you are short of breath, but never out of breath, for at least 30 minutes daily. As you improve, it will actually be easier for you to do the same amount of exercise  in  30 minutes so always push to the level where you are short of breath.    Make sure you check your oxygen saturation  AT  your highest level of activity (not after you stop)   to be sure it stays over 90% and adjust  02 flow upward to maintain this level if needed but remember to turn it back to previous settings when you stop (to conserve your supply).    Please schedule a follow up visit in 3 months but call sooner if needed with PFTS in meantime 1st available

## 2023-12-10 NOTE — Progress Notes (Unsigned)
 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    ECHO  11/30/23 nl LV/RV/LA   History of Present Illness  12/10/2023  Pulmonary/ 1st Odom eval/ Dakota Odom / Dakota Odom advair 250 bid x feb 2025  Chief Complaint  Patient presents with   Establish Care    Shob -   Dyspnea:  across the house  Cough: none  Sleep:  corner of couch x Jan 2023 SABA use: 8-10 times per day p overdoes it 02: none     No obvious day to day or daytime pattern/variability or assoc excess/ purulent sputum or mucus plugs or hemoptysis or cp or chest tightness, subjective wheeze or overt sinus or hb symptoms.    Also denies any obvious fluctuation of symptoms with weather or environmental changes or other aggravating or alleviating factors except as outlined above   No unusual exposure hx or h/o childhood pna/ asthma or knowledge of premature birth.  Current Allergies, Complete Past Medical History, Past Surgical History, Family History, and Social History were reviewed in [employer] record.  ROS  The following are not active complaints unless bolded Hoarseness, sore throat, dysphagia, dental problems, itching, sneezing,  nasal congestion or discharge of excess mucus or purulent secretions, ear ache,   fever, chills, sweats, unintended wt loss or wt gain, classically pleuritic or exertional cp,  orthopnea pnd or arm/hand swelling  or leg swelling, presyncope, palpitations, abdominal pain, anorexia, nausea, vomiting, diarrhea  or change in bowel habits or change in bladder habits, change in stools or change in urine, dysuria, hematuria,  rash, arthralgias, visual complaints, headache, numbness, weakness or ataxia or problems with walking or  coordination,  change in mood or  memory.            Outpatient Medications Prior to Visit  Medication Sig Dispense Refill   albuterol (VENTOLIN HFA) 108 (90 Base) MCG/ACT inhaler Inhale 2 puffs into the lungs every 4 (four) hours as needed.     aspirin  EC 81 MG tablet Take 1 tablet (81 mg total) by mouth daily.     atorvastatin  (LIPITOR ) 80 MG tablet Take 1 tablet (80 mg total) by mouth daily. 90 tablet 3   cetirizine (ZYRTEC) 10 MG tablet Take 10 mg by mouth daily.     clopidogrel  (PLAVIX ) 75 MG tablet Take 1 tablet (75 mg total) by mouth daily. 90 tablet 0   cyanocobalamin (VITAMIN B12) 1000 MCG tablet Take 2 tablets by mouth daily.     ferrous sulfate  325 (65 FE) MG tablet Take 1 tablet (325 mg total) by mouth daily. 30 tablet 0   fluticasone-salmeterol (ADVAIR) 250-50 MCG/ACT AEPB Inhale 1 puff into the lungs in the morning and at bedtime.     furosemide  (LASIX ) 40 MG tablet Take 1 tablet (40 mg total) by mouth daily. (Patient taking differently: Take 20 mg by mouth daily.) 30 tablet 0   JARDIANCE  10 MG TABS tablet TAKE (1) TABLET BY MOUTH DAILY 90 tablet 1   metFORMIN (GLUCOPHAGE) 500 MG tablet Take 500 mg by mouth 2 (two) times daily. (Patient taking differently: Take 850 mg by mouth 2 (two) times daily.)  metoprolol  succinate (TOPROL -XL) 25 MG 24 hr tablet Take 1 tablet (25 mg total) by mouth daily. 90 tablet 0   Multiple Vitamin (MULTIVITAMIN WITH MINERALS) TABS tablet Take 1 tablet by mouth daily.     naproxen (NAPROSYN) 500 MG tablet Take 500 mg by mouth 2 (two) times daily with a meal.     nitroGLYCERIN  (NITROSTAT ) 0.4 MG SL tablet Place 1 tablet (0.4 mg total) under the tongue every 5 (five) minutes x 3 doses as needed for chest pain. 25 tablet 3   potassium chloride  SA (KLOR-CON  M) 20 MEQ tablet Take 1 daily for three days then as needed thereafter (Patient taking differently: 20 mEq daily. Take 1 daily for three days then as needed thereafter) 30 tablet 3   terbinafine  (LAMISIL) 250 MG tablet Take 250 mg by mouth daily.     No facility-administered medications prior to visit.    Past Medical History:  Diagnosis Date   Anemia    new in 2023   CAD (coronary artery disease)    Chronic heart failure with preserved ejection fraction (HFpEF) (HCC)    Fungal infection of foot    Hypertension    MI (myocardial infarction) (HCC)    Morbid obesity (HCC)    Pre-diabetes       Objective:     BP 122/72   Pulse (!) 103   Ht 5' 6 (1.676 m)   Wt (!) 302 lb 3.2 oz (137.1 kg)   SpO2 90% Comment: ra  BMI 48.78 kg/m   SpO2: 90 % (ra) pleasant amb  MO (by BMI) nad    HEENT : Oropharynx  clear/ top dentures     Nasal turbinates nl    NECK :  without  apparent JVD/ palpable Nodes/TM    LUNGS: no acc muscle use,  Nl contour chest which is clear to A and P bilaterally without cough on insp or exp maneuvers   CV:  RRR  no s3 or murmur or increase in P2, and no edema   ABD:  quite obese but soft and nontender   MS:  Gait nl l  ext warm without deformities Or obvious joint restrictions  calf tenderness, cyanosis or clubbing    SKIN: warm and dry without lesions    NEURO:  alert, approp, nl sensorium with  no motor or cerebellar deficits apparent.       I personally reviewed images and agree with radiology impression as follows:  CXR:   pa and lateral 06/18/23 Low lung volumes persist with no change in the appearance of the chest x-ray, and no definite evidence of acute cardiopulmonary disease.   Labs ordered/ reviewed:      Chemistry      Component Value Date/Time   NA 143 10/22/2023 1244   K 4.4 10/22/2023 1244   CL 103 10/22/2023 1244   CO2 21 10/22/2023 1244   BUN 14 10/22/2023 1244   CREATININE 1.01 10/22/2023 1244      Component Value Date/Time   CALCIUM  9.2 10/22/2023 1244   ALKPHOS 69 04/26/2023 1757   AST 31 04/26/2023 1757   ALT 32 04/26/2023 1757   BILITOT 0.4 04/26/2023 1757   BILITOT 0.4 02/25/2022 1640        Lab  Results  Component Value Date   WBC 8.1 04/26/2023   HGB 13.3 04/26/2023   HCT 42.4 04/26/2023   MCV 84.1 04/26/2023   PLT 318 04/26/2023        Lab Results  Component Value Date   TSH 1.690 02/25/2022     Lab Results  Component Value Date   PROBNP 98 10/22/2023         Assessment   Assessment & Plan DOE (dyspnea on exertion) Stopped smoking  03/2021  with onset of ht dz at wt 264 lb - Echo 11/30/23 ok - maint on advair 250 as of initial pulmonary ov but overusing saba p exertion  - 12/10/2023 wt 302 desats walking > see ex hypoxemia  - PFTs ordered 12/10/2023 >>>   Not clear he really has asthma as always uses saba p ex so rec for now continue advair 250 pending pfts and more approp sabs  .Re SABA :  I spent extra time with pt today reviewing appropriate use of albuterol for prn use on exertion with the following points: 1) saba is for relief of sob that does not improve by walking a slower pace or resting but rather if the pt does not improve after trying this first. 2) If the pt is convinced, as many are, that saba helps recover from activity faster then it's easy to tell if this is the case by re-challenging : ie stop, take the inhaler, then p 5 minutes try the exact same activity (intensity of workload) that just caused the symptoms and see if they are substantially diminished or not after saba 3) if there is an activity that reproducibly causes the symptoms, try the saba 15 min before the activity on alternate days   If in fact the saba really does help, then fine to continue to use it prn but advised may need to look closer at the maintenance regimen (advair now, ? Needs trelegy 100 next)  being used to achieve better control of airways disease with exertion.    Morbid obesity (HCC) Body mass index is 48.78 kg/m.    Lab Results  Component Value Date   TSH 1.690 02/25/2022   Contributing to doe and risk of GERD/ >>>   reviewed the need and the process to achieve  and maintain neg calorie balance > defer f/u primary care including intermittently monitoring thyroid status      Exercise hypoxemia 12/10/2023 at 302lb    Walked on RA  x  2  lap(s) =  approx 300  ft  @ mod pace, stopped due to desats to 87% improved to 92 % on 3lpm cont    but stopped due to Back pain   Rec 3-4 lpm cont (so not a candidate for POC) with exertion/ titrate to keep > 90% - see avs for instructions unique to this ov   Each maintenance medication was reviewed in detail including emphasizing most importantly the difference between maintenance and prns and under what circumstances the prns are to be triggered using an action plan format where appropriate.  Total time for H and P, chart review, counseling, reviewing hfa/ 02 /pulse ox  device(s) , directly observing portions of ambulatory 02 saturation study/ and generating customized AVS unique to this Odom visit / same day charting = 47 min new pt eval                  AVS  Patient Instructions  To get the most out of exercise, you need to be continuously aware that you are short of breath, but never out of breath, for at least 30 minutes daily. As you improve, it will actually be easier for you to do the same amount of exercise  in  30 minutes so always push to the level where you are short of breath.     Make sure you check your oxygen saturation  AT  your highest level of activity (not after you stop)   to be sure it stays over 90% and adjust  02 flow upward to maintain this level if needed but remember to turn it back to previous settings when you stop (to conserve your supply).    Please schedule a follow up visit in 3 months but call sooner if needed with PFTS in meantime 1st available     Ozell America, MD 12/11/2023

## 2023-12-11 DIAGNOSIS — R0902 Hypoxemia: Secondary | ICD-10-CM | POA: Insufficient documentation

## 2023-12-11 DIAGNOSIS — R0609 Other forms of dyspnea: Secondary | ICD-10-CM | POA: Insufficient documentation

## 2023-12-11 NOTE — Assessment & Plan Note (Addendum)
 12/10/2023 at 302lb    Walked on RA  x  2  lap(s) =  approx 300  ft  @ mod pace, stopped due to desats to 87% improved to 92 % on 3lpm cont    but stopped due to Back pain   Rec 3-4 lpm cont (so not a candidate for POC) with exertion/ titrate to keep > 90% - see avs for instructions unique to this ov   Each maintenance medication was reviewed in detail including emphasizing most importantly the difference between maintenance and prns and under what circumstances the prns are to be triggered using an action plan format where appropriate.  Total time for H and P, chart review, counseling, reviewing hfa/ 02 /pulse ox  device(s) , directly observing portions of ambulatory 02 saturation study/ and generating customized AVS unique to this office visit / same day charting = 47 min new pt eval

## 2023-12-11 NOTE — Assessment & Plan Note (Addendum)
 Stopped smoking  03/2021  with onset of ht dz at wt 264 lb - Echo 11/30/23 ok - maint on advair 250 as of initial pulmonary ov but overusing saba p exertion  - 12/10/2023 wt 302 desats walking > see ex hypoxemia  - PFTs ordered 12/10/2023 >>>   Not clear he really has asthma as always uses saba p ex so rec for now continue advair 250 pending pfts and more approp sabs  .Re SABA :  I spent extra time with pt today reviewing appropriate use of albuterol for prn use on exertion with the following points: 1) saba is for relief of sob that does not improve by walking a slower pace or resting but rather if the pt does not improve after trying this first. 2) If the pt is convinced, as many are, that saba helps recover from activity faster then it's easy to tell if this is the case by re-challenging : ie stop, take the inhaler, then p 5 minutes try the exact same activity (intensity of workload) that just caused the symptoms and see if they are substantially diminished or not after saba 3) if there is an activity that reproducibly causes the symptoms, try the saba 15 min before the activity on alternate days   If in fact the saba really does help, then fine to continue to use it prn but advised may need to look closer at the maintenance regimen (advair now, ? Needs trelegy 100 next)  being used to achieve better control of airways disease with exertion.

## 2023-12-11 NOTE — Assessment & Plan Note (Addendum)
 Body mass index is 48.78 kg/m.    Lab Results  Component Value Date   TSH 1.690 02/25/2022   Contributing to doe and risk of GERD/ >>>   reviewed the need and the process to achieve and maintain neg calorie balance > defer f/u primary care including intermittently monitoring thyroid status

## 2023-12-15 ENCOUNTER — Telehealth: Payer: Self-pay | Admitting: Internal Medicine

## 2023-12-15 NOTE — Telephone Encounter (Signed)
 Per Dakota Odom at [employer]- We will need sats entered or co-signed by provider.

## 2023-12-16 NOTE — Telephone Encounter (Signed)
 I added the walk to the ov note

## 2023-12-16 NOTE — Progress Notes (Signed)
 Ambulatory Pulse Oximetry  Row Name 12/10/23 1619      Resting  Resting Heart Rate 81      Resting Sp02 90      Lap 1 (250 feet)  HR 106      02 Sat 87      Lap 2 (250 feet)  HR 108      02 Sat 92      Lap 3 (250 feet)  Tech Comments: completed 2 laps at a mod pace, placed on 3l cont due to desat. D/c walked due to back pain

## 2023-12-16 NOTE — Telephone Encounter (Signed)
 Sent update to [employer]. NFN

## 2023-12-17 NOTE — Telephone Encounter (Signed)
 Per Avelina at [employer]-  Those sats wont work. If a pt desats while walking on room air we need all three sats.  __% on RA at rest __%on RA with exertion __% on __L of O2 with exertion  If a PT desats on room air while at rest we only need that sat documented.  Please advise

## 2023-12-17 NOTE — Telephone Encounter (Signed)
 Patient is scheduled 12/22/23 at 2:45 pm and he is aware

## 2023-12-17 NOTE — Telephone Encounter (Signed)
 Pt did not desat on ra and was unable to completed 3 laps due to back pain -pt will have to have a repeat walk to qualify with 3 laps or deast on ra.   Pt needs appt for walk

## 2023-12-17 NOTE — Telephone Encounter (Signed)
 Sam- please correct the walk test as she outlined below or you can schedule pt to come in for walk test

## 2023-12-20 NOTE — Progress Notes (Unsigned)
 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    ECHO  11/30/23 nl LV/RV/LA    History of Present Illness  12/10/2023  Pulmonary/ 1st office eval/ [facility] / [facility] advair 250 bid x feb 2025 but not consistent with it /technique  Chief Complaint  Patient presents with   Establish Care    Shob -   Dyspnea:  across the house  Cough: none  Sleep:  corner of couch x Jan 2023 SABA use: 8-10 times per day p overdoes it 02: none Rec To get the most out of exercise, you need to be continuously aware that you are short of breath, but never out of breath, for at least 30 minutes daily.     Make sure you check your oxygen saturation  AT  your highest level of activity (not after you stop)   to be sure it stays over 90%   Please schedule a follow up visit in 3 months but call sooner if needed with PFTS in meantime 1st available > not done as of 12/22/2023    12/22/2023  f/u ov/[place] [facility]/Lesleigh Hughson re: doe  maint on advair 250  and much less saba since last ov   Chief Complaint  Patient presents with   Shortness of Breath    O2 determination    Dyspnea:  ex 2-3 x daily  85% lowest sats  Cough: daytime dry  Sleeping: almost sitting straight up due to dypnea flatter (baseline x Jan 2013     SABA use: much less than initial ov  02: none   Lung cancer screening: ordered today    No obvious day to day or daytime variability or assoc excess/ purulent sputum or mucus plugs or hemoptysis or cp or chest tightness, subjective wheeze or overt sinus or hb symptoms.    Also denies any obvious fluctuation of symptoms with weather or environmental changes or other aggravating or alleviating factors except as outlined above   No unusual  exposure hx or h/o childhood pna/ asthma or knowledge of premature birth.  Current Allergies, Complete Past Medical History, Past Surgical History, Family History, and Social History were reviewed in [employer] record.  ROS  The following are not active complaints unless bolded Hoarseness, sore throat, dysphagia, dental problems, itching, sneezing,  nasal congestion or discharge of excess mucus or purulent secretions, ear ache,   fever, chills, sweats, unintended wt loss or wt gain, classically pleuritic or exertional cp,  orthopnea pnd or arm/hand swelling  or leg swelling, presyncope, palpitations, abdominal pain, anorexia, nausea, vomiting, diarrhea  or change in bowel habits or change in bladder habits, change in stools or change in urine, dysuria, hematuria,  rash, arthralgias, visual complaints, headache, numbness, weakness or ataxia or problems with walking or coordination,  change in mood or  memory.        Current Meds  Medication Sig   albuterol (VENTOLIN HFA) 108 (90 Base) MCG/ACT inhaler Inhale 2 puffs into the lungs every 4 (four) hours as needed.   aspirin  EC 81 MG tablet Take 1 tablet (81 mg total) by mouth daily.   atorvastatin  (LIPITOR ) 80 MG tablet  Take 1 tablet (80 mg total) by mouth daily.   cetirizine (ZYRTEC) 10 MG tablet Take 10 mg by mouth daily.   clopidogrel  (PLAVIX ) 75 MG tablet Take 1 tablet (75 mg total) by mouth daily.   cyanocobalamin (VITAMIN B12) 1000 MCG tablet Take 2 tablets by mouth daily.   ferrous sulfate  325 (65 FE) MG tablet Take 1 tablet (325 mg total) by mouth daily.   fluticasone-salmeterol (ADVAIR) 250-50 MCG/ACT AEPB Inhale 1 puff into the lungs in the morning and at bedtime.   furosemide  (LASIX ) 40 MG tablet Take 1 tablet (40 mg total) by mouth daily. (Patient taking differently: Take 20 mg by mouth daily.)   JARDIANCE  10 MG TABS tablet TAKE (1) TABLET BY MOUTH DAILY   metFORMIN (GLUCOPHAGE) 500 MG tablet Take 500 mg by mouth 2  (two) times daily. (Patient taking differently: Take 850 mg by mouth 2 (two) times daily.)   metoprolol  succinate (TOPROL -XL) 25 MG 24 hr tablet Take 1 tablet (25 mg total) by mouth daily.   Multiple Vitamin (MULTIVITAMIN WITH MINERALS) TABS tablet Take 1 tablet by mouth daily.   naproxen (NAPROSYN) 500 MG tablet Take 500 mg by mouth 2 (two) times daily with a meal.   nitroGLYCERIN  (NITROSTAT ) 0.4 MG SL tablet Place 1 tablet (0.4 mg total) under the tongue every 5 (five) minutes x 3 doses as needed for chest pain.   potassium chloride  SA (KLOR-CON  M) 20 MEQ tablet Take 1 daily for three days then as needed thereafter (Patient taking differently: 20 mEq daily. Take 1 daily for three days then as needed thereafter)   terbinafine (LAMISIL) 250 MG tablet Take 250 mg by mouth daily.              Past Medical History:  Diagnosis Date   Anemia    new in 2023   CAD (coronary artery disease)    Chronic heart failure with preserved ejection fraction (HFpEF) (HCC)    Fungal infection of foot    Hypertension    MI (myocardial infarction) (HCC)    Morbid obesity (HCC)    Pre-diabetes       Objective:     Wt Readings from Last 3 Encounters:  12/22/23 300 lb (136.1 kg)  12/10/23 (!) 302 lb 3.2 oz (137.1 kg)  10/30/23 (!) 303 lb (137.4 kg)    Vital signs reviewed  12/22/2023  - Note at rest 02 sats  91% on RA at rest    General appearance:    MO (by BMI) amb wm / both legs with lyphedema wrapped    HEENT : Oropharynx  clear      Nasal turbinates nl    NECK :  without  apparent JVD/ palpable Nodes/TM    LUNGS: no acc muscle use,  Nl contour chest which is clear to A and P bilaterally without cough on insp or exp maneuvers   CV:  RRR  no s3 or murmur or increase in P2, and no edema   ABD:  soft and nontender   MS:  Gait nl   ext warm without deformities Or obvious joint restrictions  calf tenderness, cyanosis or clubbing    SKIN: warm and dry without lesions    NEURO:  alert,  approp, nl sensorium with  no motor or cerebellar deficits apparent.     Assessment     Assessment & Plan DOE (dyspnea on exertion) Stopped smoking  03/2021  with onset of ht dz at wt 264 lb - Echo  11/30/23 ok - maint on advair 250 as of initial pulmonary ov but overusing saba p exertion  - 12/10/2023 wt 302 desats walking > see ex hypoxemia  - PFTs ordered 12/10/2023 >>>   Improved on advair 250 so ok to leave it on until returns for pfts  Exercise hypoxemia 12/10/2023 at 302lb    Walked on RA  x  2  lap(s) =  approx 300  ft  @ mod pace, stopped due to desats to 87% improved to 92 % on 3lpm cont    but stopped due to Back pain   >>> 12/22/2023   Walked on RA  x  2  lap(s) =  approx 300  ft  @ slow to moderate pace, stopped due to sob  with lowest 02 sats 88% recovered on 2lpm and able to complete 3rd lap feeling much improve with lowest sats 93%   Advised: target sats > 90% by walking slower or increasing 02 flow rate - see avs for instructions unique to this ov  Former cigarette smoker Quit smoking 03/2021  Low-dose CT lung cancer screening is recommended for patients who are 54-46 years of age with a 20+ pack-year history of smoking and who are currently smoking or quit <=15 years ago. No coughing up blood  No unintentional weight loss of > 15 pounds in the last 6 months - pt is eligible for scanning yearly until 03/2036 > referred     Morbid obesity (HCC) Body mass index is 48.42 kg/m.   Lab Results  Component Value Date   TSH 1.690 02/25/2022   Contributing to doe and risk of GERD/dvt/PE >>>   reviewed the need and the process to achieve and maintain neg calorie balance > defer f/u primary care including intermittently monitoring thyroid status    Each maintenance medication was reviewed in detail including emphasizing most importantly the difference between maintenance and prns and under what circumstances the prns are to be triggered using an action plan format where  appropriate.  Total time for H and P, chart review, counseling,  directly observing portions of ambulatory 02 saturation study/ and generating customized AVS unique to this office visit / same day charting = 31 min                   AVS  Patient Instructions  My office will be contacting you by phone for referral to lung cancer screening   [phone]) - if you don't hear back from my office within one week,  please call us  back or notify us  thru MyChart and we'll address it right away.    Make sure you check your oxygen saturation  AT  your highest level of activity (not after you stop)   to be sure it stays over 90% and adjust  02 flow upward to maintain this level if needed but remember to turn it back to previous settings when you stop (to conserve your supply).   Please schedule a follow up visit in 3 months but call sooner if needed    Ozell America, MD 12/22/2023

## 2023-12-22 ENCOUNTER — Encounter: Payer: Self-pay | Admitting: Internal Medicine

## 2023-12-22 ENCOUNTER — Ambulatory Visit: Admitting: Internal Medicine

## 2023-12-22 VITALS — BP 137/61 | HR 70 | Ht 66.0 in | Wt 300.0 lb

## 2023-12-22 DIAGNOSIS — Z6841 Body Mass Index (BMI) 40.0 and over, adult: Secondary | ICD-10-CM

## 2023-12-22 DIAGNOSIS — R0609 Other forms of dyspnea: Secondary | ICD-10-CM

## 2023-12-22 DIAGNOSIS — R0902 Hypoxemia: Secondary | ICD-10-CM

## 2023-12-22 DIAGNOSIS — Z87891 Personal history of nicotine dependence: Secondary | ICD-10-CM | POA: Insufficient documentation

## 2023-12-22 NOTE — Assessment & Plan Note (Addendum)
 Quit smoking 03/2021  Low-dose CT lung cancer screening is recommended for patients who are 62-68 years of age with a 20+ pack-year history of smoking and who are currently smoking or quit <=15 years ago. No coughing up blood  No unintentional weight loss of > 15 pounds in the last 6 months - pt is eligible for scanning yearly until 03/2036 > referred

## 2023-12-22 NOTE — Assessment & Plan Note (Addendum)
 Stopped smoking  03/2021  with onset of ht dz at wt 264 lb - Echo 11/30/23 ok - maint on advair 250 as of initial pulmonary ov but overusing saba p exertion  - 12/10/2023 wt 302 desats walking > see ex hypoxemia  - PFTs ordered 12/10/2023 >>>   Improved on advair 250 so ok to leave it on until returns for pfts

## 2023-12-22 NOTE — Patient Instructions (Signed)
 My office will be contacting you by phone for referral to lung cancer screening   [phone]) - if you don't hear back from my office within one week,  please call us  back or notify us  thru MyChart and we'll address it right away.    Make sure you check your oxygen saturation  AT  your highest level of activity (not after you stop)   to be sure it stays over 90% and adjust  02 flow upward to maintain this level if needed but remember to turn it back to previous settings when you stop (to conserve your supply).   Please schedule a follow up visit in 3 months but call sooner if needed

## 2023-12-22 NOTE — Assessment & Plan Note (Addendum)
 Body mass index is 48.42 kg/m.   Lab Results  Component Value Date   TSH 1.690 02/25/2022   Contributing to doe and risk of GERD/dvt/PE >>>   reviewed the need and the process to achieve and maintain neg calorie balance > defer f/u primary care including intermittently monitoring thyroid status    Each maintenance medication was reviewed in detail including emphasizing most importantly the difference between maintenance and prns and under what circumstances the prns are to be triggered using an action plan format where appropriate.  Total time for H and P, chart review, counseling,  directly observing portions of ambulatory 02 saturation study/ and generating customized AVS unique to this office visit / same day charting = 31 min

## 2023-12-22 NOTE — Assessment & Plan Note (Addendum)
 12/10/2023 at 302lb    Walked on RA  x  2  lap(s) =  approx 300  ft  @ mod pace, stopped due to desats to 87% improved to 92 % on 3lpm cont    but stopped due to Back pain   >>> 12/22/2023   Walked on RA  x  2  lap(s) =  approx 300  ft  @ slow to moderate pace, stopped due to sob  with lowest 02 sats 88% recovered on 2lpm and able to complete 3rd lap feeling much improve with lowest sats 93%   Advised: target sats > 90% by walking slower or increasing 02 flow rate - see avs for instructions unique to this ov

## 2024-01-08 ENCOUNTER — Telehealth: Payer: Self-pay | Admitting: Acute Care

## 2024-01-08 DIAGNOSIS — Z122 Encounter for screening for malignant neoplasm of respiratory organs: Secondary | ICD-10-CM

## 2024-01-08 DIAGNOSIS — Z87891 Personal history of nicotine dependence: Secondary | ICD-10-CM

## 2024-01-08 NOTE — Telephone Encounter (Signed)
 Lung Cancer Screening Narrative/Criteria Questionnaire (Cigarette Smokers Only- No Cigars/Pipes/vapes)   Dakota Odom   SDMV:01/21/2024 1:00 Kristen       05-12-1955   LDCT: 01/28/2024 8:30 AP    68 y.o.   Phone: [phone]  Lung Screening Narrative (confirm age 48-77 yrs Medicare / 50-80 yrs Private pay insurance)   Insurance information:[employer] mcr   Referring Provider:Dr. Darlean   This screening involves an initial phone call with a team member from our program. It is called a shared decision making visit. The initial meeting is required by  insurance and Medicare to make sure you understand the program. This appointment takes about 15-20 minutes to complete. You will complete the screening scan at your scheduled date/time.  This scan takes about 5-10 minutes to complete. You can eat and drink normally before and after the scan.  Criteria questions for Lung Cancer Screening:   Are you a current or former smoker? Former Age began smoking: 68yo   If you are a former smoker, what year did you quit smoking? 2022(within 15 yrs)   To calculate your smoking history, I need an accurate estimate of how many packs of cigarettes you smoked per day and for how many years. (Not just the number of PPD you are now smoking)   Years smoking 49 x Packs per day 1.5 = Pack years 73.5   (at least 20 pack yrs)   (Make sure they understand that we need to know how much they have smoked in the past, not just the number of PPD they are smoking now)  Do you have a personal history of cancer?  No    Do you have a family history of cancer? No  Are you coughing up blood?  No  Have you had unexplained weight loss of 15 lbs or more in the last 6 months? No  It looks like you meet all criteria.  When would be a good time for us  to schedule you for this screening?   Additional information: N/A

## 2024-01-21 ENCOUNTER — Encounter: Payer: Self-pay | Admitting: *Deleted

## 2024-01-21 ENCOUNTER — Ambulatory Visit: Admitting: *Deleted

## 2024-01-21 DIAGNOSIS — Z87891 Personal history of nicotine dependence: Secondary | ICD-10-CM | POA: Diagnosis not present

## 2024-01-21 NOTE — Progress Notes (Signed)
 Virtual Visit via Telephone Note  I connected with Dakota Odom on 01/21/24 at  1:00 PM EDT by telephone and verified that I am speaking with the correct person using two identifiers.  Location: Patient: in home Provider: [address], [place], [place], [address]    Shared Decision Making Visit Lung Cancer Screening Program [phone])   Eligibility: Age 68 y.o. Pack Years Smoking History Calculation 73.5 (# packs/per year x # years smoked) Recent History of coughing up blood  no Unexplained weight loss? no ( >Than 15 pounds within the last 6 months ) Prior History Lung / other cancer no (Diagnosis within the last 5 years already requiring surveillance chest CT Scans). Smoking Status Former Smoker Former Smokers: Years since quit: 3 years  Quit Date: 2022  Visit Components: Discussion included one or more decision making aids. yes Discussion included risk/benefits of screening. yes Discussion included potential follow up diagnostic testing for abnormal scans. yes Discussion included meaning and risk of over diagnosis. yes Discussion included meaning and risk of False Positives. yes Discussion included meaning of total radiation exposure. yes  Counseling Included: Importance of adherence to annual lung cancer LDCT screening. yes Impact of comorbidities on ability to participate in the program. yes Ability and willingness to under diagnostic treatment. yes  Smoking Cessation Counseling: Current Smokers:  Discussed importance of smoking cessation. yes Information about tobacco cessation classes and interventions provided to patient. yes Patient provided with ticket for LDCT Scan. yes Symptomatic Patient. no  Counseling NA Diagnosis Code: Tobacco Use Z72.0 Asymptomatic Patient yes  Counseling (Intermediate counseling: > three minutes counseling) [id number]  Counseled patient 4 minutes regarding tobacco use.   Former Smokers:  Discussed the importance of maintaining  cigarette abstinence. yes Diagnosis Code: Personal History of Nicotine  Dependence. S12.108 Information about tobacco cessation classes and interventions provided to patient. Yes Patient provided with ticket for LDCT Scan. yes Written Order for Lung Cancer Screening with LDCT placed in Epic. Yes (CT Chest Lung Cancer Screening Low Dose W/O CM) [id number] Z12.2-Screening of respiratory organs Z87.891-Personal history of nicotine  dependence   Josette Ranger, RN 01/21/24

## 2024-01-21 NOTE — Patient Instructions (Signed)

## 2024-01-25 NOTE — H&P (View-Only) (Signed)
 Cardiology Office Note    Date:  01/26/2024  ID:  Dakota Odom, DOB [birth year], MRN [id number] PCP:  Benjamin Raina Elizabeth, NP  Cardiologist:  Dorn Lesches, MD  Electrophysiologist:  None   Chief Complaint: dyspnea on exertion  History of Present Illness: .    Dakota Odom is a 68 y.o. male with visit-pertinent history of CAD (inferior STEMI 03/2021 s/p DES to RCA with residual disease treated medically, HLD, former tobacco abuse, morbid obesity, family history of CAD, pre-DM, HTN who is seen for follow-up.   He had an inferior STEMI 03/2021 with occluded RCA treated with overlapping DESx3. He had otherwise 70% distal LM without significant disease in LAD/LCx, recommended for medical therapy. 2D echo 03/2021 EF 60-65%, moderate LVH, normal RV, aortic sclerosis without stenosis, possible slight hypokinesis of the mid inferior wall incompletely visualized. I met him in 2023 when he was noting persistent DOE. He also had developed LE on exam. I ordered echo and sleep study which he deferred due to insurance issues with work. I had reached out to Dr. Lesches about transitioning from Brilinta  to Plavix . Labs came back demonstrating new microcytic anemia in the 10 range so he was sent to the ER to rule out GIB prior to loading with Plavix . FOBT was negative and he was discharged with recommendation for OP GI f/u. He was then switched from Brilinta  to Plavix , with trial of short course of Lasix /KCl for edema/HTN. He initially did not return for f/u bloodwork or echo. He later saw Dr. Lesches and Jackee Alberts, NP. At last OV 10/2023 he was still experiencing DOE therefore echo and nuc were ordered. Jackee noted that patient had been on abx for cellulitis and following with lymphedema clinic. Nuclear stress test 10/2023 showed normal perfusion, EF 64%, low risk. 2d echo showed EF 55-60%, normal diastolic parameters, hyperdynamic RV, trivial MR, tech difficult. He also saw pulmonology in August/September 2025  who noted desaturations with activity and prescribed O2 with exertion. He has a lung cancer screening CT and PFTs coming up. He reports that pulmonologist also plans to discuss sleep study once initial testing is obtained.  He is seen for follow-up today. He reports that his dyspnea on exertion has continued to progress. He now has to stop and rest just walking from his house out to the road and back due to dyspnea. He denies any exertional angina though notes he has used SL NTG approximately 4x over the last 1 year in different circumstances; the last episode was 2 weeks ago for a fleeting episode of indigestion-like pain. He continues to have significant LE edema and keeps his extremities wrapped. His weight has steadily increased over the last few years. As recently as 2021 he and his wife really enjoyed hiking in the mountains.  Labwork independently reviewed: 10/2023 pBNP wnl, K 4.4, Cr 1.01 04/2023 CBC ok, LFTs ok 2023 TSH OK  ROS: .    Please see the history of present illness. All other systems are reviewed and otherwise negative.  Studies Reviewed: SABRA    EKG:  EKG is ordered today, personally reviewed, demonstrating:  EKG Interpretation Date/Time:  Tuesday January 26 2024 12:09:01 EDT Ventricular Rate:  96 PR Interval:  234 QRS Duration:  94 QT Interval:  340 QTC Calculation: 429 R Axis:   4  Text Interpretation: Sinus rhythm with 1st degree A-V block Minimal voltage criteria for LVH, may be normal variant ( R in aVL ) Nonspecific T wave abnormality When compared  with ECG of 03-Aug-2023 15:00, No significant change was found Confirmed by Elkin Belfield [phone]) on 01/26/2024 12:31:32 PM  CV Studies: Cardiac studies reviewed are outlined and summarized above. Otherwise please see EMR for full report.   Current Reported Medications:.    Current Meds  Medication Sig   albuterol (VENTOLIN HFA) 108 (90 Base) MCG/ACT inhaler Inhale 2 puffs into the lungs every 4 (four) hours as  needed.   aspirin  EC 81 MG tablet Take 1 tablet (81 mg total) by mouth daily.   atorvastatin  (LIPITOR ) 80 MG tablet Take 1 tablet (80 mg total) by mouth daily.   cetirizine (ZYRTEC) 10 MG tablet Take 10 mg by mouth daily.   clopidogrel  (PLAVIX ) 75 MG tablet Take 1 tablet (75 mg total) by mouth daily.   cyanocobalamin (VITAMIN B12) 1000 MCG tablet Take 2 tablets by mouth daily.   ferrous sulfate  325 (65 FE) MG tablet Take 1 tablet (325 mg total) by mouth daily.   fluticasone-salmeterol (ADVAIR) 250-50 MCG/ACT AEPB Inhale 1 puff into the lungs in the morning and at bedtime.   furosemide  (LASIX ) 20 MG tablet Take 20 mg by mouth daily.   JARDIANCE  10 MG TABS tablet TAKE (1) TABLET BY MOUTH DAILY   metFORMIN (GLUCOPHAGE) 850 MG tablet Take 850 mg by mouth 2 (two) times daily with a meal.   metoprolol  succinate (TOPROL -XL) 25 MG 24 hr tablet Take 1 tablet (25 mg total) by mouth daily.   Multiple Vitamin (MULTIVITAMIN WITH MINERALS) TABS tablet Take 1 tablet by mouth daily.   naproxen (NAPROSYN) 500 MG tablet Take 500 mg by mouth 2 (two) times daily with a meal.   nitroGLYCERIN  (NITROSTAT ) 0.4 MG SL tablet Place 1 tablet (0.4 mg total) under the tongue every 5 (five) minutes x 3 doses as needed for chest pain.   potassium chloride  SA (KLOR-CON  M) 20 MEQ tablet Take 20 mEq by mouth daily.   terbinafine (LAMISIL) 250 MG tablet Take 250 mg by mouth daily.   [DISCONTINUED] furosemide  (LASIX ) 40 MG tablet Take 1 tablet (40 mg total) by mouth daily. (Patient taking differently: Take 20 mg by mouth daily.)   [DISCONTINUED] metFORMIN (GLUCOPHAGE) 500 MG tablet Take 500 mg by mouth 2 (two) times daily. (Patient taking differently: Take 850 mg by mouth 2 (two) times daily.)   [DISCONTINUED] potassium chloride  SA (KLOR-CON  M) 20 MEQ tablet Take 1 daily for three days then as needed thereafter (Patient taking differently: 20 mEq daily. Take 1 daily for three days then as needed thereafter)    Physical Exam:     VS:  BP 138/60   Ht 5' 6 (1.676 m)   Wt 300 lb (136.1 kg)   BMI 48.42 kg/m    Wt Readings from Last 3 Encounters:  01/26/24 300 lb (136.1 kg)  12/22/23 300 lb (136.1 kg)  12/10/23 (!) 302 lb 3.2 oz (137.1 kg)    GEN: Well nourished, well developed in no acute distress NECK: No JVD; No carotid bruits CARDIAC: RRR, no murmurs, rubs, gallops RESPIRATORY:  Clear to auscultation without rales, wheezing or rhonchi  ABDOMEN: Soft, non-tender, non-distended EXTREMITIES:  No edema; No acute deformity   Asessement and Plan:.    1. Dyspnea on exertion, lower extremity edema, atypical angina, CAD and suspected chronic HFpEF - suspect this is a mixed picture, multifactorial, from morbid obesity with possible OHS/OSA, with possible component of chronic HFpEF with false negative BNP, as well as underlying CAD. Volume status difficult to assess with severe obesity.  Discussed with Dr. Court. We will plan [facility] for further evaluation. In the meantime, continue ASA 81mg  daily, Plavix  75mg  daily (DAPT plan TBD based on cath findings), Toprol  25mg  daily, Lasix  20mg  daily, potassium 20meq daily, Jardiance  10mg  daily. See below regarding options for intensification of GDMT depending on cath results. Might be a good candidate to consider torsemide instead of furosemide . Given DAPT, also advised he discontinue naproxen and discuss alternatives with PCP tomorrow. Of note, he does mention he has carpal tunnel disease bilaterally. If R/LHC workup unrevealing, can review need for infiltrative eval though no LVH on echocardiogram. Get CBC, CMET, TSH today as well. Discussed low sodium diet.  Informed Consent   Shared Decision Making/Informed Consent The risks [stroke (1 in 1000), death (1 in 1000), kidney failure [usually temporary] (1 in 500), bleeding (1 in 200), allergic reaction [possibly serious] (1 in 200)], benefits (diagnostic support and management of coronary artery disease) and alternatives of a cardiac  catheterization were discussed in detail with Mr. Acy and he is willing to proceed.     2. Essential HTN - initial BP 150/60, recheck 138/60. Recent trends 130s-150s by outside office values. BP suboptimally controlled. I discussed with Dr. Court. We will await results of R/LHC before adjusting medications further. Would probably avoid amlodipine due to chronic edema since there are other options which include ARB vs Entresto, spironolactone, or changing Toprol  to carvedilol.  3. Hyperlipidemia - remains on atorvastatin  80mg  daily. He had [employer] this morning so held off checking lipid profile. Discussed contribution of sodium and fast food to presentation above. He goes for a physical with his PCP tomorrow, I asked him to see if they will check lipids. If not, would revisit at next OV. Getting f/u CMET today.  4. Morbid obesity with weight gain - check TSH with labs. He is agreeable/interested in referral to [facility] for comprehensive management. He reports pulmonology plans to explore sleep apnea eval in the near future.    Disposition: F/u with me in 3 weeks.  Signed, Shavana Calder N Bintou Lafata, PA-C

## 2024-01-25 NOTE — Progress Notes (Unsigned)
 Cardiology Office Note    Date:  01/26/2024  ID:  NEITHAN DAY, DOB January 09, 1956, MRN [id number] PCP:  Benjamin Raina Elizabeth, NP  Cardiologist:  Dorn Lesches, MD  Electrophysiologist:  None   Chief Complaint: dyspnea on exertion  History of Present Illness: .    ONA RATHERT is a 68 y.o. male with visit-pertinent history of CAD (inferior STEMI 03/2021 s/p DES to RCA with residual disease treated medically, HLD, former tobacco abuse, morbid obesity, family history of CAD, pre-DM, HTN who is seen for follow-up.   He had an inferior STEMI 03/2021 with occluded RCA treated with overlapping DESx3. He had otherwise 70% distal LM without significant disease in LAD/LCx, recommended for medical therapy. 2D echo 03/2021 EF 60-65%, moderate LVH, normal RV, aortic sclerosis without stenosis, possible slight hypokinesis of the mid inferior wall incompletely visualized. I met him in 2023 when he was noting persistent DOE. He also had developed LE on exam. I ordered echo and sleep study which he deferred due to insurance issues with work. I had reached out to Dr. Lesches about transitioning from Brilinta  to Plavix . Labs came back demonstrating new microcytic anemia in the 10 range so he was sent to the ER to rule out GIB prior to loading with Plavix . FOBT was negative and he was discharged with recommendation for OP GI f/u. He was then switched from Brilinta  to Plavix , with trial of short course of Lasix /KCl for edema/HTN. He initially did not return for f/u bloodwork or echo. He later saw Dr. Lesches and Jackee Alberts, NP. At last OV 10/2023 he was still experiencing DOE therefore echo and nuc were ordered. Jackee noted that patient had been on abx for cellulitis and following with lymphedema clinic. Nuclear stress test 10/2023 showed normal perfusion, EF 64%, low risk. 2d echo showed EF 55-60%, normal diastolic parameters, hyperdynamic RV, trivial MR, tech difficult. He also saw pulmonology in August/September 2025  who noted desaturations with activity and prescribed O2 with exertion. He has a lung cancer screening CT and PFTs coming up. He reports that pulmonologist also plans to discuss sleep study once initial testing is obtained.  He is seen for follow-up today. He reports that his dyspnea on exertion has continued to progress. He now has to stop and rest just walking from his house out to the road and back due to dyspnea. He denies any exertional angina though notes he has used SL NTG approximately 4x over the last 1 year in different circumstances; the last episode was 2 weeks ago for a fleeting episode of indigestion-like pain. He continues to have significant LE edema and keeps his extremities wrapped. His weight has steadily increased over the last few years. As recently as 2021 he and his wife really enjoyed hiking in the mountains.  Labwork independently reviewed: 10/2023 pBNP wnl, K 4.4, Cr 1.01 04/2023 CBC ok, LFTs ok 2023 TSH OK  ROS: .    Please see the history of present illness. All other systems are reviewed and otherwise negative.  Studies Reviewed: SABRA    EKG:  EKG is ordered today, personally reviewed, demonstrating:  EKG Interpretation Date/Time:  Tuesday January 26 2024 12:09:01 EDT Ventricular Rate:  96 PR Interval:  234 QRS Duration:  94 QT Interval:  340 QTC Calculation: 429 R Axis:   4  Text Interpretation: Sinus rhythm with 1st degree A-V block Minimal voltage criteria for LVH, may be normal variant ( R in aVL ) Nonspecific T wave abnormality When compared  with ECG of 03-Aug-2023 15:00, No significant change was found Confirmed by Lejon Afzal [phone]) on 01/26/2024 12:31:32 PM  CV Studies: Cardiac studies reviewed are outlined and summarized above. Otherwise please see EMR for full report.   Current Reported Medications:.    Current Meds  Medication Sig   albuterol (VENTOLIN HFA) 108 (90 Base) MCG/ACT inhaler Inhale 2 puffs into the lungs every 4 (four) hours as  needed.   aspirin  EC 81 MG tablet Take 1 tablet (81 mg total) by mouth daily.   atorvastatin  (LIPITOR ) 80 MG tablet Take 1 tablet (80 mg total) by mouth daily.   cetirizine (ZYRTEC) 10 MG tablet Take 10 mg by mouth daily.   clopidogrel  (PLAVIX ) 75 MG tablet Take 1 tablet (75 mg total) by mouth daily.   cyanocobalamin (VITAMIN B12) 1000 MCG tablet Take 2 tablets by mouth daily.   ferrous sulfate  325 (65 FE) MG tablet Take 1 tablet (325 mg total) by mouth daily.   fluticasone-salmeterol (ADVAIR) 250-50 MCG/ACT AEPB Inhale 1 puff into the lungs in the morning and at bedtime.   furosemide  (LASIX ) 20 MG tablet Take 20 mg by mouth daily.   JARDIANCE  10 MG TABS tablet TAKE (1) TABLET BY MOUTH DAILY   metFORMIN (GLUCOPHAGE) 850 MG tablet Take 850 mg by mouth 2 (two) times daily with a meal.   metoprolol  succinate (TOPROL -XL) 25 MG 24 hr tablet Take 1 tablet (25 mg total) by mouth daily.   Multiple Vitamin (MULTIVITAMIN WITH MINERALS) TABS tablet Take 1 tablet by mouth daily.   naproxen (NAPROSYN) 500 MG tablet Take 500 mg by mouth 2 (two) times daily with a meal.   nitroGLYCERIN  (NITROSTAT ) 0.4 MG SL tablet Place 1 tablet (0.4 mg total) under the tongue every 5 (five) minutes x 3 doses as needed for chest pain.   potassium chloride  SA (KLOR-CON  M) 20 MEQ tablet Take 20 mEq by mouth daily.   terbinafine (LAMISIL) 250 MG tablet Take 250 mg by mouth daily.   [DISCONTINUED] furosemide  (LASIX ) 40 MG tablet Take 1 tablet (40 mg total) by mouth daily. (Patient taking differently: Take 20 mg by mouth daily.)   [DISCONTINUED] metFORMIN (GLUCOPHAGE) 500 MG tablet Take 500 mg by mouth 2 (two) times daily. (Patient taking differently: Take 850 mg by mouth 2 (two) times daily.)   [DISCONTINUED] potassium chloride  SA (KLOR-CON  M) 20 MEQ tablet Take 1 daily for three days then as needed thereafter (Patient taking differently: 20 mEq daily. Take 1 daily for three days then as needed thereafter)    Physical Exam:     VS:  BP 138/60   Ht 5' 6 (1.676 m)   Wt 300 lb (136.1 kg)   BMI 48.42 kg/m    Wt Readings from Last 3 Encounters:  01/26/24 300 lb (136.1 kg)  12/22/23 300 lb (136.1 kg)  12/10/23 (!) 302 lb 3.2 oz (137.1 kg)    GEN: Well nourished, well developed in no acute distress NECK: No JVD; No carotid bruits CARDIAC: RRR, no murmurs, rubs, gallops RESPIRATORY:  Clear to auscultation without rales, wheezing or rhonchi  ABDOMEN: Soft, non-tender, non-distended EXTREMITIES:  No edema; No acute deformity   Asessement and Plan:.    1. Dyspnea on exertion, lower extremity edema, atypical angina, CAD and suspected chronic HFpEF - suspect this is a mixed picture, multifactorial, from morbid obesity with possible OHS/OSA, with possible component of chronic HFpEF with false negative BNP, as well as underlying CAD. Volume status difficult to assess with severe obesity.  Discussed with Dr. Court. We will plan [facility] for further evaluation. In the meantime, continue ASA 81mg  daily, Plavix  75mg  daily (DAPT plan TBD based on cath findings), Toprol  25mg  daily, Lasix  20mg  daily, potassium 20meq daily, Jardiance  10mg  daily. See below regarding options for intensification of GDMT depending on cath results. Might be a good candidate to consider torsemide instead of furosemide . Given DAPT, also advised he discontinue naproxen and discuss alternatives with PCP tomorrow. Of note, he does mention he has carpal tunnel disease bilaterally. If R/LHC workup unrevealing, can review need for infiltrative eval though no LVH on echocardiogram. Get CBC, CMET, TSH today as well. Discussed low sodium diet.  Informed Consent   Shared Decision Making/Informed Consent The risks [stroke (1 in 1000), death (1 in 1000), kidney failure [usually temporary] (1 in 500), bleeding (1 in 200), allergic reaction [possibly serious] (1 in 200)], benefits (diagnostic support and management of coronary artery disease) and alternatives of a cardiac  catheterization were discussed in detail with Mr. Brouillard and he is willing to proceed.     2. Essential HTN - initial BP 150/60, recheck 138/60. Recent trends 130s-150s by outside office values. BP suboptimally controlled. I discussed with Dr. Court. We will await results of R/LHC before adjusting medications further. Would probably avoid amlodipine due to chronic edema since there are other options which include ARB vs Entresto, spironolactone, or changing Toprol  to carvedilol.  3. Hyperlipidemia - remains on atorvastatin  80mg  daily. He had [employer] this morning so held off checking lipid profile. Discussed contribution of sodium and fast food to presentation above. He goes for a physical with his PCP tomorrow, I asked him to see if they will check lipids. If not, would revisit at next OV. Getting f/u CMET today.  4. Morbid obesity with weight gain - check TSH with labs. He is agreeable/interested in referral to [facility] for comprehensive management. He reports pulmonology plans to explore sleep apnea eval in the near future.    Disposition: F/u with me in 3 weeks.  Signed, [place] N Paizlie Klaus, PA-C

## 2024-01-26 ENCOUNTER — Encounter (INDEPENDENT_AMBULATORY_CARE_PROVIDER_SITE_OTHER): Payer: Self-pay

## 2024-01-26 ENCOUNTER — Encounter: Payer: Self-pay | Admitting: Physician Assistant

## 2024-01-26 ENCOUNTER — Ambulatory Visit: Attending: Physician Assistant | Admitting: Physician Assistant

## 2024-01-26 VITALS — BP 138/60 | Ht 66.0 in | Wt 300.0 lb

## 2024-01-26 DIAGNOSIS — R0609 Other forms of dyspnea: Secondary | ICD-10-CM | POA: Diagnosis not present

## 2024-01-26 DIAGNOSIS — I10 Essential (primary) hypertension: Secondary | ICD-10-CM

## 2024-01-26 DIAGNOSIS — I25118 Atherosclerotic heart disease of native coronary artery with other forms of angina pectoris: Secondary | ICD-10-CM

## 2024-01-26 DIAGNOSIS — R600 Localized edema: Secondary | ICD-10-CM

## 2024-01-26 DIAGNOSIS — E785 Hyperlipidemia, unspecified: Secondary | ICD-10-CM

## 2024-01-26 DIAGNOSIS — I2510 Atherosclerotic heart disease of native coronary artery without angina pectoris: Secondary | ICD-10-CM

## 2024-01-26 DIAGNOSIS — I2089 Other forms of angina pectoris: Secondary | ICD-10-CM

## 2024-01-26 MED ORDER — NITROGLYCERIN 0.4 MG SL SUBL: 0.4000 mg | SUBLINGUAL_TABLET | SUBLINGUAL | 3 refills | Status: AC | PRN

## 2024-01-26 NOTE — Patient Instructions (Addendum)
 Patients taking blood thinners should generally stay away from medicines like ibuprofen, Advil, Motrin, Naprosyn, naproxen, and Aleve due to risk of stomach bleeding. You may take Tylenol  as directed or talk to your primary doctor about alternatives.   Medication Instructions:  No changes    Nitroglycerin  refilled   *If you need a refill on your cardiac medications before your next appointment, please call your pharmacy*   Lab Work: CMET CBC TSH If you have labs (blood work) drawn today and your tests are completely normal, you will receive your results only by: MyChart Message (if you have MyChart) OR A paper copy in the mail If you have any lab test that is abnormal or we need to change your treatment, we will call you to review the results.   Testing/Procedures: Schedule for 10 /21/25 Your physician has requested that you have a cardiac catheterization. Cardiac catheterization is used to diagnose and/or treat various heart conditions. Doctors may recommend this procedure for a number of different reasons. The most common reason is to evaluate chest pain. Chest pain can be a symptom of coronary artery disease (CAD), and cardiac catheterization can show whether plaque is narrowing or blocking your heart's arteries. This procedure is also used to evaluate the valves, as well as measure the blood flow and oxygen levels in different parts of your heart. For further information please visit [email or web address] Please follow instruction sheet, as given.    Follow-Up: At [facility], you and your health needs are our priority.  As part of our continuing mission to provide you with exceptional heart care, we have created designated Provider Care Teams.  These Care Teams include your primary Cardiologist (physician) and Advanced Practice Providers (APPs -  Physician Assistants and Nurse Practitioners) who all work together to provide you with the care you need, when you need it.     Your  next appointment:   3 to 4 week(s)  The format for your next appointment:   In Person  Provider:   Dayna Dunn, PA-C     You have been referred to  [facility]- their office will call to set up apoointment    Other Instructions     [facility] A DEPT OF [place]. [facility] [address] A DEPT OF THE [place]. [facility] [address] [place] [address] Dept: [phone] Loc: [phone]  TAYJON HALLADAY  01/26/2024  You are scheduled for a Right and Left Cardiac Catheterization on Tuesday, October 21 with Dr. Alm Clay.  1. Please arrive at the [facility] (Main Entrance A) at [facility]: [address] [place], [place] [address] at 8:30 AM (This time is 2 hour(s) before your procedure to ensure your preparation).   Free valet parking service is available. You will check in at ADMITTING. The support person will be asked to wait in the waiting room.  It is OK to have someone drop you off and come back when you are ready to be discharged.    Special note: Every effort is made to have your procedure done on time. Please understand that emergencies sometimes delay scheduled procedures.  2. Diet: Nothing to eat after midnight.   3. Hydration: You need to be well hydrated before your procedure. On October 21, you may drink approved liquids (see below) until 2 hours before the procedure, with 16 oz of water as your last intake.   List of approved liquids water,  clear juice, clear tea, black coffee, fruit juices, non-citric and without pulp, carbonated beverages, Gatorade, Kool -Aid, plain Jello-O and plain ice popsicles.  4. Labs: You will need to have blood drawn on Tuesday, October 14 at [facility] (1st Floor), [address], [place], [place] [address]. You do not need to be fasting.  5. Medication instructions in preparation for your procedure:   Contrast Allergy:  No   Stop taking, Lasix  (Furosemide )  Tuesday, October 21,    Do not take Diabetes Med Glucophage (Metformin)  850 mg on the day of the procedure and HOLD 48 HOURS AFTER THE PROCEDURE.,  The last dose of Jardiance  10 mg before procedure  Oct 17,2025 ( Friday )   On the morning of your procedure, take your Aspirin  81 mg and Plavix /Clopidogrel   75 mg and any morning medicines NOT listed above.  You may use sips of water.  6. Plan to go home the same day, you will only stay overnight if medically necessary. 7. Bring a current list of your medications and current insurance cards. 8. You MUST have a responsible person to drive you home. 9. Someone MUST be with you the first 24 hours after you arrive home or your discharge will be delayed. 10. Please wear clothes that are easy to get on and off and wear slip-on shoes.  Thank you for allowing us  to care for you!   -- [facility]

## 2024-01-27 ENCOUNTER — Ambulatory Visit: Payer: Self-pay | Admitting: Physician Assistant

## 2024-01-27 LAB — TSH: TSH: 0.912 u[IU]/mL (ref 0.450–4.500)

## 2024-01-27 LAB — COMPREHENSIVE METABOLIC PANEL WITH GFR
ALT: 53 IU/L — ABNORMAL HIGH (ref 0–44)
AST: 58 IU/L — ABNORMAL HIGH (ref 0–40)
Albumin: 4 g/dL (ref 3.9–4.9)
Alkaline Phosphatase: 102 IU/L (ref 47–123)
BUN/Creatinine Ratio: 14 (ref 10–24)
BUN: 13 mg/dL (ref 8–27)
Bilirubin Total: 0.4 mg/dL (ref 0.0–1.2)
CO2: 21 mmol/L (ref 20–29)
Calcium: 9.4 mg/dL (ref 8.6–10.2)
Chloride: 104 mmol/L (ref 96–106)
Creatinine, Ser: 0.95 mg/dL (ref 0.76–1.27)
Globulin, Total: 2.8 g/dL (ref 1.5–4.5)
Glucose: 95 mg/dL (ref 70–99)
Potassium: 4.3 mmol/L (ref 3.5–5.2)
Sodium: 142 mmol/L (ref 134–144)
Total Protein: 6.8 g/dL (ref 6.0–8.5)
eGFR: 87 mL/min/1.73 (ref >59)

## 2024-01-27 LAB — CBC
Hematocrit: 39.4 % (ref 37.5–51.0)
Hemoglobin: 12.5 g/dL — ABNORMAL LOW (ref 13.0–17.7)
MCH: 25.1 pg — ABNORMAL LOW (ref 26.6–33.0)
MCHC: 31.7 g/dL (ref 31.5–35.7)
MCV: 79 fL (ref 79–97)
Platelets: 228 x10E3/uL (ref 150–450)
RBC: 4.99 x10E6/uL (ref 4.14–5.80)
RDW: 16.8 % — ABNORMAL HIGH (ref 11.6–15.4)
WBC: 6.2 x10E3/uL (ref 3.4–10.8)

## 2024-01-27 NOTE — Telephone Encounter (Signed)
 Spoke with pt regarding lab results. Pt verbalized understanding and agreed to discuss with PCP at f/u appt today. Pt had no further questions.

## 2024-01-28 ENCOUNTER — Ambulatory Visit (HOSPITAL_COMMUNITY)
Admission: RE | Admit: 2024-01-28 | Discharge: 2024-01-28 | Disposition: A | Discharge status: Home / Self Care | Source: Ambulatory Visit | Attending: Acute Care | Admitting: Acute Care

## 2024-01-28 DIAGNOSIS — Z122 Encounter for screening for malignant neoplasm of respiratory organs: Secondary | ICD-10-CM | POA: Diagnosis present

## 2024-01-28 DIAGNOSIS — Z87891 Personal history of nicotine dependence: Secondary | ICD-10-CM | POA: Insufficient documentation

## 2024-02-01 ENCOUNTER — Telehealth: Payer: Self-pay | Admitting: *Deleted

## 2024-02-01 NOTE — Telephone Encounter (Signed)
 Cardiac Catheterization scheduled at [facility] for: Tuesday February 02, 2024 10:30 AM Arrival time [facility] Main Entrance A at: 8:30 AM  Diet: -Nothing to eat after midnight.  Hydration: -May drink clear liquids until 2 hours before the procedure.  Approved liquids: Water, clear tea, black coffee, fruit juices-non-citric and without pulp,Gatorade, plain Jello/popsicles.   -Please drink 16 oz of water 2 hours before procedure.  Medication instructions: -Hold:   Lasix /KCl/Jardiance -AM of procedure  Metformin-day of procedure and 48 hours post procedure  -Other usual morning medications can be taken including aspirin  81 mg and Plavix  75 mg.  Plan to go home the same day, you will only stay overnight if medically necessary.  You must have responsible adult to drive you home.  Someone must be with you the first 24 hours after you arrive home.  Reviewed procedure instructions with patient.

## 2024-02-02 ENCOUNTER — Other Ambulatory Visit: Payer: Self-pay

## 2024-02-02 ENCOUNTER — Encounter (HOSPITAL_COMMUNITY)
Admission: RE | Disposition: A | Discharge status: Home / Self Care | Payer: Self-pay | Source: Home / Self Care | Attending: Cardiology

## 2024-02-02 ENCOUNTER — Ambulatory Visit (HOSPITAL_COMMUNITY)
Admission: RE | Admit: 2024-02-02 | Discharge: 2024-02-02 | Disposition: A | Attending: Cardiology | Admitting: Cardiology

## 2024-02-02 DIAGNOSIS — I350 Nonrheumatic aortic (valve) stenosis: Secondary | ICD-10-CM | POA: Diagnosis not present

## 2024-02-02 DIAGNOSIS — I2510 Atherosclerotic heart disease of native coronary artery without angina pectoris: Secondary | ICD-10-CM | POA: Diagnosis not present

## 2024-02-02 DIAGNOSIS — E785 Hyperlipidemia, unspecified: Secondary | ICD-10-CM | POA: Diagnosis not present

## 2024-02-02 DIAGNOSIS — Z79899 Other long term (current) drug therapy: Secondary | ICD-10-CM | POA: Diagnosis not present

## 2024-02-02 DIAGNOSIS — I272 Pulmonary hypertension, unspecified: Secondary | ICD-10-CM | POA: Insufficient documentation

## 2024-02-02 DIAGNOSIS — Z720 Tobacco use: Secondary | ICD-10-CM | POA: Diagnosis not present

## 2024-02-02 DIAGNOSIS — I25118 Atherosclerotic heart disease of native coronary artery with other forms of angina pectoris: Secondary | ICD-10-CM | POA: Diagnosis not present

## 2024-02-02 DIAGNOSIS — R7303 Prediabetes: Secondary | ICD-10-CM | POA: Diagnosis not present

## 2024-02-02 DIAGNOSIS — Z7982 Long term (current) use of aspirin: Secondary | ICD-10-CM | POA: Diagnosis not present

## 2024-02-02 DIAGNOSIS — I10 Essential (primary) hypertension: Secondary | ICD-10-CM | POA: Insufficient documentation

## 2024-02-02 DIAGNOSIS — Z6841 Body Mass Index (BMI) 40.0 and over, adult: Secondary | ICD-10-CM | POA: Diagnosis not present

## 2024-02-02 DIAGNOSIS — I252 Old myocardial infarction: Secondary | ICD-10-CM | POA: Diagnosis not present

## 2024-02-02 DIAGNOSIS — Z955 Presence of coronary angioplasty implant and graft: Secondary | ICD-10-CM | POA: Insufficient documentation

## 2024-02-02 DIAGNOSIS — Z8249 Family history of ischemic heart disease and other diseases of the circulatory system: Secondary | ICD-10-CM | POA: Insufficient documentation

## 2024-02-02 DIAGNOSIS — Z7984 Long term (current) use of oral hypoglycemic drugs: Secondary | ICD-10-CM | POA: Diagnosis not present

## 2024-02-02 DIAGNOSIS — R0609 Other forms of dyspnea: Secondary | ICD-10-CM

## 2024-02-02 DIAGNOSIS — I2089 Other forms of angina pectoris: Secondary | ICD-10-CM

## 2024-02-02 HISTORY — PX: RIGHT/LEFT HEART CATH AND CORONARY ANGIOGRAPHY: CATH118266

## 2024-02-02 HISTORY — PX: CORONARY ULTRASOUND/IVUS: CATH118244

## 2024-02-02 LAB — POCT I-STAT EG7
Acid-base deficit: 2 mmol/L (ref 0.0–2.0)
Acid-base deficit: 2 mmol/L (ref 0.0–2.0)
Bicarbonate: 23.1 mmol/L (ref 20.0–28.0)
Bicarbonate: 23.2 mmol/L (ref 20.0–28.0)
Calcium, Ion: 1.21 mmol/L (ref 1.15–1.40)
Calcium, Ion: 1.22 mmol/L (ref 1.15–1.40)
HCT: 37 % — ABNORMAL LOW (ref 39.0–52.0)
HCT: 37 % — ABNORMAL LOW (ref 39.0–52.0)
Hemoglobin: 12.6 g/dL — ABNORMAL LOW (ref 13.0–17.0)
Hemoglobin: 12.6 g/dL — ABNORMAL LOW (ref 13.0–17.0)
O2 Saturation: 69 %
O2 Saturation: 72 %
Potassium: 4 mmol/L (ref 3.5–5.1)
Potassium: 4.1 mmol/L (ref 3.5–5.1)
Sodium: 140 mmol/L (ref 135–145)
Sodium: 141 mmol/L (ref 135–145)
TCO2: 24 mmol/L (ref 22–32)
TCO2: 24 mmol/L (ref 22–32)
pCO2, Ven: 40.1 mmHg — ABNORMAL LOW (ref 44–60)
pCO2, Ven: 40.3 mmHg — ABNORMAL LOW (ref 44–60)
pH, Ven: 7.368 (ref 7.25–7.43)
pH, Ven: 7.369 (ref 7.25–7.43)
pO2, Ven: 37 mmHg (ref 32–45)
pO2, Ven: 39 mmHg (ref 32–45)

## 2024-02-02 LAB — POCT I-STAT 7, (LYTES, BLD GAS, ICA,H+H)
Acid-base deficit: 3 mmol/L — ABNORMAL HIGH (ref 0.0–2.0)
Bicarbonate: 21.5 mmol/L (ref 20.0–28.0)
Calcium, Ion: 1.19 mmol/L (ref 1.15–1.40)
HCT: 37 % — ABNORMAL LOW (ref 39.0–52.0)
Hemoglobin: 12.6 g/dL — ABNORMAL LOW (ref 13.0–17.0)
O2 Saturation: 97 %
Potassium: 4 mmol/L (ref 3.5–5.1)
Sodium: 141 mmol/L (ref 135–145)
TCO2: 23 mmol/L (ref 22–32)
pCO2 arterial: 37.4 mmHg (ref 32–48)
pH, Arterial: 7.369 (ref 7.35–7.45)
pO2, Arterial: 88 mmHg (ref 83–108)

## 2024-02-02 LAB — GLUCOSE, CAPILLARY
Glucose-Capillary: 125 mg/dL — ABNORMAL HIGH (ref 70–99)
Glucose-Capillary: 115 mg/dL — ABNORMAL HIGH (ref 70–99)

## 2024-02-02 LAB — POCT ACTIVATED CLOTTING TIME: Activated Clotting Time: 285 s

## 2024-02-02 SURGERY — RIGHT/LEFT HEART CATH AND CORONARY ANGIOGRAPHY
Anesthesia: LOCAL

## 2024-02-02 MED ORDER — FREE WATER: 250.0000 mL | Freq: Once | Status: DC

## 2024-02-02 MED ORDER — VERAPAMIL HCL 2.5 MG/ML IV SOLN
INTRAVENOUS | Status: DC | PRN
  Administered 2024-02-02: 10 mL via INTRA_ARTERIAL

## 2024-02-02 MED ORDER — ONDANSETRON HCL 4 MG/2ML IJ SOLN: 4.0000 mg | Freq: Four times a day (QID) | INTRAMUSCULAR | Status: DC | PRN

## 2024-02-02 MED ORDER — ASPIRIN 81 MG PO CHEW: 81.0000 mg | CHEWABLE_TABLET | ORAL | Status: DC

## 2024-02-02 MED ORDER — SODIUM CHLORIDE 0.9% FLUSH: 3.0000 mL | Freq: Two times a day (BID) | INTRAVENOUS | Status: DC

## 2024-02-02 MED ORDER — SODIUM CHLORIDE 0.9% FLUSH: 3.0000 mL | INTRAVENOUS | Status: DC | PRN

## 2024-02-02 MED ORDER — FENTANYL CITRATE (PF) 100 MCG/2ML IJ SOLN
INTRAMUSCULAR | Status: AC
  Filled 2024-02-02: qty 2

## 2024-02-02 MED ORDER — MIDAZOLAM HCL (PF) 2 MG/2ML IJ SOLN
INTRAMUSCULAR | Status: DC | PRN
  Administered 2024-02-02 (×3): 1 mg via INTRAVENOUS

## 2024-02-02 MED ORDER — LABETALOL HCL 5 MG/ML IV SOLN: 10.0000 mg | INTRAVENOUS | Status: DC | PRN

## 2024-02-02 MED ORDER — IOHEXOL 350 MG/ML SOLN
INTRAVENOUS | Status: DC | PRN
  Administered 2024-02-02: 175 mL

## 2024-02-02 MED ORDER — HYDRALAZINE HCL 20 MG/ML IJ SOLN: 10.0000 mg | INTRAMUSCULAR | Status: DC | PRN

## 2024-02-02 MED ORDER — MIDAZOLAM HCL 2 MG/2ML IJ SOLN
INTRAMUSCULAR | Status: AC
  Filled 2024-02-02: qty 2

## 2024-02-02 MED ORDER — HEPARIN SODIUM (PORCINE) 1000 UNIT/ML IJ SOLN
INTRAMUSCULAR | Status: AC
  Filled 2024-02-02: qty 10

## 2024-02-02 MED ORDER — HEPARIN SODIUM (PORCINE) 1000 UNIT/ML IJ SOLN
INTRAMUSCULAR | Status: DC | PRN
  Administered 2024-02-02: 7000 [IU] via INTRAVENOUS
  Administered 2024-02-02: 6500 [IU] via INTRAVENOUS

## 2024-02-02 MED ORDER — NITROGLYCERIN 1 MG/10 ML FOR IR/CATH LAB
INTRA_ARTERIAL | Status: AC
  Filled 2024-02-02: qty 10

## 2024-02-02 MED ORDER — FREE WATER: 500.0000 mL | Freq: Once | Status: DC

## 2024-02-02 MED ORDER — SODIUM CHLORIDE 0.9 % IV SOLN: 250.0000 mL | INTRAVENOUS | Status: DC | PRN

## 2024-02-02 MED ORDER — LIDOCAINE HCL (PF) 1 % IJ SOLN
INTRAMUSCULAR | Status: DC | PRN
  Administered 2024-02-02: 2 mL

## 2024-02-02 MED ORDER — ACETAMINOPHEN 325 MG PO TABS: 650.0000 mg | ORAL_TABLET | ORAL | Status: DC | PRN

## 2024-02-02 MED ORDER — VERAPAMIL HCL 2.5 MG/ML IV SOLN
INTRAVENOUS | Status: AC
  Filled 2024-02-02: qty 2

## 2024-02-02 MED ORDER — LIDOCAINE HCL (PF) 1 % IJ SOLN
INTRAMUSCULAR | Status: AC
  Filled 2024-02-02: qty 30

## 2024-02-02 MED ORDER — NITROGLYCERIN 1 MG/10 ML FOR IR/CATH LAB
INTRA_ARTERIAL | Status: DC | PRN
  Administered 2024-02-02: 200 ug via INTRACORONARY

## 2024-02-02 MED ORDER — HEPARIN (PORCINE) IN NACL 1000-0.9 UT/500ML-% IV SOLN
INTRAVENOUS | Status: DC | PRN
  Administered 2024-02-02 (×2): 500 mL

## 2024-02-02 MED ORDER — FENTANYL CITRATE (PF) 100 MCG/2ML IJ SOLN
INTRAMUSCULAR | Status: DC | PRN
  Administered 2024-02-02 (×3): 25 ug via INTRAVENOUS

## 2024-02-02 SURGICAL SUPPLY — 15 items
CATH 5FR JL3.5 JR4 ANG PIG MP (CATHETERS) IMPLANT
CATH BALLN WEDGE 5F 110CM (CATHETERS) IMPLANT
CATH OPTICROSS HD (CATHETERS) IMPLANT
CATH VISTA GUIDE 6FR JL3.5 MPK (CATHETERS) IMPLANT
DEVICE RAD COMP TR BAND LRG (VASCULAR PRODUCTS) IMPLANT
DRAPE IVUS SLED (BAG) IMPLANT
ELECT DEFIB PAD ADLT CADENCE (PAD) IMPLANT
GLIDESHEATH SLEND SS 6F .021 (SHEATH) IMPLANT
GUIDEWIRE INQWIRE 1.5J.035X260 (WIRE) IMPLANT
KIT ESSENTIALS PG (KITS) IMPLANT
PACK CARDIAC CATHETERIZATION (CUSTOM PROCEDURE TRAY) ×1 IMPLANT
SET ATX-X65L (MISCELLANEOUS) IMPLANT
SHEATH GLIDE SLENDER 4/5FR (SHEATH) IMPLANT
SHEATH PROBE COVER 6X72 (BAG) IMPLANT
WIRE ASAHI PROWATER 180CM (WIRE) IMPLANT

## 2024-02-02 NOTE — Progress Notes (Signed)
 PT tolerated PO intake. Incision site remains clean dry and intact. No s/s of complications. PT escorted from the unit via wheel chair to personal vehicle. Per patient received discharge instructions from [place], [place]. This RN reviewed key points with patient and wife. No additional questions at this time.

## 2024-02-02 NOTE — Interval H&P Note (Signed)
 History and Physical Interval Note:  02/02/2024 11:00 AM  Dakota Odom  has presented today for surgery, with the diagnosis of angina.  The various methods of treatment have been discussed with the patient and family. After consideration of risks, benefits and other options for treatment, the patient has consented to  Procedure(s): RIGHT/LEFT HEART CATH AND CORONARY ANGIOGRAPHY (N/A)  PERCUTANEOUS CORONARY INTERVENTION   as a surgical intervention.  The patient's history has been reviewed, patient examined, no change in status, stable for surgery.  I have reviewed the patient's chart and labs.  Questions were answered to the patient's satisfaction.    Cath Lab Visit (complete for each Cath Lab visit)  Clinical Evaluation Leading to the Procedure:   ACS: No.  Non-ACS:    Anginal Classification: CCS III  Anti-ischemic medical therapy: Minimal Therapy (1 class of medications)  Non-Invasive Test Results: No non-invasive testing performed  Prior CABG: No previous CABG   Alm Clay

## 2024-02-03 ENCOUNTER — Other Ambulatory Visit: Payer: Self-pay

## 2024-02-03 ENCOUNTER — Telehealth: Payer: Self-pay | Admitting: Acute Care

## 2024-02-03 ENCOUNTER — Encounter (HOSPITAL_COMMUNITY): Payer: Self-pay | Admitting: Cardiology

## 2024-02-03 DIAGNOSIS — Z122 Encounter for screening for malignant neoplasm of respiratory organs: Secondary | ICD-10-CM

## 2024-02-03 DIAGNOSIS — F17210 Nicotine dependence, cigarettes, uncomplicated: Secondary | ICD-10-CM

## 2024-02-03 DIAGNOSIS — Z87891 Personal history of nicotine dependence: Secondary | ICD-10-CM

## 2024-02-03 NOTE — Telephone Encounter (Signed)
 Dr. Darlean, Please see results of the LDCT. LR 2. We will order and schedule the follow up scan. Pt has loculated effusion that is stable, and CAD. He is followed by cards and is on a statin. You have ordered PFT's which have not been done . Please follow up as you feel is clinically indicated regarding the recommendation of dedicated dual phase chest CT for evaluation of possible air trapping/small airway disease. Thanks   Mosaic ground-glass attenuation of the lungs. Recommend future follow-ups with dedicated dual phase chest CT for evaluation of possible air trapping/small airway disease.   Lung-RADS 2, benign appearance or behavior. Continue annual screening with low-dose chest CT without contrast in 12 months.

## 2024-02-03 NOTE — Telephone Encounter (Signed)
 Results sent to patient and order placed.

## 2024-02-04 NOTE — Telephone Encounter (Signed)
 Appt scd for 12/11

## 2024-02-08 ENCOUNTER — Encounter (INDEPENDENT_AMBULATORY_CARE_PROVIDER_SITE_OTHER): Payer: Self-pay

## 2024-02-09 ENCOUNTER — Ambulatory Visit (HOSPITAL_COMMUNITY)
Admission: RE | Admit: 2024-02-09 | Discharge: 2024-02-09 | Disposition: A | Discharge status: Home / Self Care | Source: Ambulatory Visit | Attending: Internal Medicine | Admitting: Internal Medicine

## 2024-02-09 DIAGNOSIS — R0609 Other forms of dyspnea: Secondary | ICD-10-CM | POA: Insufficient documentation

## 2024-02-09 LAB — PULMONARY FUNCTION TEST
DL/VA % pred: 131 %
DL/VA: 5.42 ml/min/mmHg/L
DLCO cor % pred: 69 %
DLCO cor: 16.05 ml/min/mmHg
DLCO unc % pred: 65 %
DLCO unc: 15.01 ml/min/mmHg
FEF 25-75 Post: 1.51 L/s
FEF 25-75 Pre: 1.42 L/s
FEF2575-%Change-Post: 6 %
FEF2575-%Pred-Post: 68 %
FEF2575-%Pred-Pre: 64 %
FEV1-%Change-Post: 0 %
FEV1-%Pred-Post: 54 %
FEV1-%Pred-Pre: 54 %
FEV1-Post: 1.54 L
FEV1-Pre: 1.53 L
FEV1FVC-%Change-Post: 1 %
FEV1FVC-%Pred-Pre: 105 %
FEV6-%Change-Post: 0 %
FEV6-%Pred-Post: 53 %
FEV6-%Pred-Pre: 54 %
FEV6-Post: 1.94 L
FEV6-Pre: 1.95 L
FEV6FVC-%Pred-Post: 106 %
FEV6FVC-%Pred-Pre: 106 %
FVC-%Change-Post: 0 %
FVC-%Pred-Post: 50 %
FVC-%Pred-Pre: 50 %
FVC-Post: 1.94 L
FVC-Pre: 1.95 L
Post FEV1/FVC ratio: 80 %
Post FEV6/FVC ratio: 100 %
Pre FEV1/FVC ratio: 78 %
Pre FEV6/FVC Ratio: 100 %
RV % pred: 63 %
RV: 1.38 L
TLC % pred: 55 %
TLC: 3.47 L

## 2024-02-09 MED ORDER — ALBUTEROL SULFATE (2.5 MG/3ML) 0.083% IN NEBU
2.5000 mg | INHALATION_SOLUTION | Freq: Once | RESPIRATORY_TRACT | Status: AC
  Administered 2024-02-09: 2.5 mg via RESPIRATORY_TRACT

## 2024-02-14 ENCOUNTER — Ambulatory Visit: Payer: Self-pay | Admitting: Internal Medicine

## 2024-02-15 ENCOUNTER — Other Ambulatory Visit: Payer: Self-pay | Admitting: Cardiovascular Disease

## 2024-02-15 ENCOUNTER — Encounter: Payer: Self-pay | Admitting: Radiology

## 2024-02-15 NOTE — Progress Notes (Signed)
 Called and relayed results to pt, and they confirmed understanding Pt informed me that he qualified for 02 and the sent him o2 tank and a bag to carry and he said he has had back problems and can not carry that tank at all and he would like to be qualified for a POC   Pt needs appt for poc qualification

## 2024-02-16 ENCOUNTER — Ambulatory Visit: Admitting: Internal Medicine

## 2024-02-16 ENCOUNTER — Encounter: Payer: Self-pay | Admitting: Internal Medicine

## 2024-02-16 VITALS — BP 136/68 | HR 111 | Ht 66.0 in | Wt 298.0 lb

## 2024-02-16 DIAGNOSIS — Z87891 Personal history of nicotine dependence: Secondary | ICD-10-CM

## 2024-02-16 DIAGNOSIS — R0609 Other forms of dyspnea: Secondary | ICD-10-CM | POA: Diagnosis not present

## 2024-02-16 DIAGNOSIS — Z6841 Body Mass Index (BMI) 40.0 and over, adult: Secondary | ICD-10-CM

## 2024-02-16 DIAGNOSIS — R0902 Hypoxemia: Secondary | ICD-10-CM | POA: Diagnosis not present

## 2024-02-16 NOTE — Patient Instructions (Signed)
 Make sure you check your oxygen saturation  AT  your highest level of activity (not after you stop)   to be sure it stays over 90% and adjust  02 flow upward to maintain this level if needed but remember to turn it back to previous settings when you stop (to conserve your supply).    Change advair to just taking it in the AM and the pm dose is optional.  You may benefit from sleep medicine evaluation

## 2024-02-16 NOTE — Progress Notes (Unsigned)
 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    ECHO  11/30/23 nl LV/RV/LA    History of Present Illness  12/10/2023  Pulmonary/ 1st office eval/ Darlean / [place] advair 250 bid x feb 2025 but not consistent with it /technique  Chief Complaint  Patient presents with   Establish Care    Shob -   Dyspnea:  across the house  Cough: none  Sleep:  corner of couch x Jan 2023 SABA use: 8-10 times per day p overdoes it 02: none Rec To get the most out of exercise, you need to be continuously aware that you are short of breath, but never out of breath, for at least 30 minutes daily.     Make sure you check your oxygen saturation  AT  your highest level of activity (not after you stop)   to be sure it stays over 90%   Please schedule a follow up visit in 3 months but call sooner if needed with PFTS in meantime 1st available > not done as of 12/22/2023    12/22/2023  f/u ov/[place] office/Senaida Chilcote re: doe  maint on advair 250  and much less saba since last ov   Chief Complaint  Patient presents with   Shortness of Breath    O2 determination    Dyspnea:  ex 2-3 x daily  85% lowest sats  Cough: daytime dry  Sleeping: almost sitting straight up due to dyspnea flatter (baseline x Jan 2013     SABA use: much less than initial ov  02: none   Rec Patient Instructions  Make sure you check your oxygen saturation  AT  your highest level of activity (not after you stop)   to be sure it stays over 90%  LDSCT  01/28/24  RADS 2 / no emphysema    02/16/2024  f/u ov/[place] office/Kavaughn Faucett re: doe  maint on advair 250 one bid   Chief Complaint  Patient presents with   Shortness of Breath    POC eval  / can not carry o2 tank  shob   Dyspnea:  no longer shopping s scooter Cough: min dry sporadic ,mostly daytime Sleeping: about 60 degrees chair or couch and wakes up feeling good  SABA use: once or twice daily  02: not wearing at hs   Lung cancer screening: q October    No obvious day to day or daytime variability or assoc excess/ purulent sputum or mucus plugs or hemoptysis or cp or chest tightness, subjective wheeze or overt sinus or hb symptoms.    Also denies any obvious fluctuation of symptoms with weather or environmental changes or other aggravating or alleviating factors except as outlined above   No unusual exposure hx or h/o childhood pna/ asthma or knowledge of premature birth.  Current Allergies, Complete Past Medical History, Past Surgical History, Family History, and Social History were reviewed in [employer] record.  ROS  The following are not active complaints unless bolded Hoarseness, sore throat, dysphagia, dental problems, itching, sneezing,  nasal congestion or discharge of excess mucus or purulent secretions, ear ache,   fever, chills, sweats, unintended wt  loss or wt gain, classically pleuritic or exertional cp,  orthopnea pnd or arm/hand swelling  or leg swelling, presyncope, palpitations, abdominal pain, anorexia, nausea, vomiting, diarrhea  or change in bowel habits or change in bladder habits, change in stools or change in urine, dysuria, hematuria,  rash, arthralgias, visual complaints, headache, numbness, weakness or ataxia or problems with walking or coordination,  change in mood or  memory.        Current Meds  Medication Sig   albuterol (VENTOLIN HFA) 108 (90 Base) MCG/ACT inhaler Inhale 2 puffs into the lungs every 4 (four) hours as needed for shortness of breath or wheezing.   aspirin  EC 81 MG tablet Take 1 tablet (81 mg total) by mouth daily.   atorvastatin  (LIPITOR ) 80 MG tablet Take 1 tablet (80 mg total) by mouth daily.   cetirizine (ZYRTEC) 10 MG tablet Take  10 mg by mouth daily.   clopidogrel  (PLAVIX ) 75 MG tablet Take 1 tablet (75 mg total) by mouth daily.   cyanocobalamin (VITAMIN B12) 1000 MCG tablet Take 2,000 mcg by mouth daily.   ferrous sulfate  325 (65 FE) MG tablet Take 1 tablet (325 mg total) by mouth daily.   fluticasone-salmeterol (ADVAIR) 250-50 MCG/ACT AEPB Inhale 1 puff into the lungs in the morning and at bedtime.   furosemide  (LASIX ) 20 MG tablet Take 20 mg by mouth daily.   JARDIANCE  10 MG TABS tablet TAKE (1) TABLET BY MOUTH DAILY   metFORMIN (GLUCOPHAGE) 850 MG tablet Take 850 mg by mouth 2 (two) times daily with a meal.   metoprolol  succinate (TOPROL -XL) 25 MG 24 hr tablet Take 1 tablet (25 mg total) by mouth daily.   Multiple Vitamin (MULTIVITAMIN WITH MINERALS) TABS tablet Take 1 tablet by mouth daily.   naproxen (NAPROSYN) 500 MG tablet Take 500 mg by mouth 2 (two) times daily with a meal.   nitroGLYCERIN  (NITROSTAT ) 0.4 MG SL tablet Place 1 tablet (0.4 mg total) under the tongue every 5 (five) minutes x 3 doses as needed for chest pain.   potassium chloride  SA (KLOR-CON  M) 20 MEQ tablet Take 20 mEq by mouth daily.           Past Medical History:  Diagnosis Date   Anemia    new in 2023   CAD (coronary artery disease)    Chronic heart failure with preserved ejection fraction (HFpEF) (HCC)    Fungal infection of foot    Hypertension    MI (myocardial infarction) (HCC)    Morbid obesity (HCC)    Pre-diabetes       Objective:     Wts   02/16/2024        298   12/22/23 300 lb (136.1 kg)  12/10/23 (!) 302 lb 3.2 oz (137.1 kg)  10/30/23 (!) 303 lb (137.4 kg)     Vital signs reviewed  02/16/2024  - Note at rest 02 sats  94% on RA   General appearance:    pleasant  MO (by bmi) wm walking with cane    HEENT : Oropharynx  clear       NECK :  without  apparent JVD/ palpable Nodes/TM    LUNGS: no acc muscle use,  Nl contour chest which is clear to A and P bilaterally without cough on insp or exp  maneuvers   CV:  RRR  no s3 or murmur or increase in P2, and wearing elastic supports for leg swelling (baseline)   ABD:  soft and nontender  MS:  Gait nl   ext warm without deformities Or obvious joint restrictions  calf tenderness, cyanosis or clubbing    SKIN: warm and dry without lesions    NEURO:  alert, approp, nl sensorium with  no motor or cerebellar deficits apparent.     Assessment      Assessment & Plan DOE (dyspnea on exertion) Stopped smoking  03/2021  with onset of ht dz at wt 264 lb - Echo 11/30/23 ok - maint on advair 250 as of initial pulmonary ov but overusing saba p exertion  - 12/10/2023 wt 302 desats walking > see ex hypoxemia  - PFT's  02/09/24  FEV1 1.54 (54 % ) ratio 0.80  p 0 % improvement from saba p advair prior to study with DLCO  15.0 (65%)   and FV curve min concave   and ERV 29% at wt 303     Most of his problem is clearly restrictive with disproportionate reduction in ERV c/w obesity, min airflow obst so rec  >>>> try change adfvair to 250 one puff daily for  few weeks then consider stopping it altogether  Exercise hypoxemia 12/10/2023 at 302lb    Walked on RA  x  2  lap(s) =  approx 300  ft  @ mod pace, stopped due to desats to 87% improved to 92 % on 3lpm cont    but stopped due to Back pain  - 12/22/2023   Walked on RA  x  2  lap(s) =  approx 300  ft  @ slow to moderate pace, stopped due to sob  with lowest 02 sats 88% recovered on 2lpm and able to complete 3rd lap feeling much improve with lowest sats 93%  - 02/16/2024   desats on RA p 150 ft and required 2lp POC m to complete another 150 ft with sats 90% when stopped due to doe >>> rec POC titrated to sats > 90% and paced exercise/ wt loss   Morbid obesity (HCC) Body mass index is 48.1 kg/m.  -  Lab Results  Component Value Date   TSH 0.912 01/26/2024    Contributing to doe and risk of GERD/dvt/ PE and ERV of 29% on pfts 02/09/24  >>>   reviewed the need and the process to achieve and  maintain neg calorie balance > defer f/u primary care including intermittently monitoring thyroid status    Consider sleep medicine referral with f/u in this clinic prn to recertify meds  Each maintenance medication was reviewed in detail including emphasizing most importantly the difference between maintenance and prns and under what circumstances the prns are to be triggered using an action plan format where appropriate.  Total time for H and P, chart review, counseling, reviewing dpi/ 02/pulse ox  device(s) , directly observing portions of ambulatory 02 saturation study/ and generating customized AVS unique to this office visit / same day charting = 41 min final summary f/u ov                   AVS  Patient Instructions  Make sure you check your oxygen saturation  AT  your highest level of activity (not after you stop)   to be sure it stays over 90% and adjust  02 flow upward to maintain this level if needed but remember to turn it back to previous settings when you stop (to conserve your supply).    Change advair to just taking it in the AM and the pm dose  is optional.  You may benefit from sleep medicine evaluation    Ozell America, MD 02/17/2024

## 2024-02-17 MED ORDER — METOPROLOL SUCCINATE ER 25 MG PO TB24: 25.0000 mg | ORAL_TABLET | Freq: Every day | ORAL | 3 refills | Status: AC

## 2024-02-17 NOTE — Assessment & Plan Note (Addendum)
 Body mass index is 48.1 kg/m.  -  Lab Results  Component Value Date   TSH 0.912 01/26/2024    Contributing to doe and risk of GERD/dvt/ PE and ERV of 29% on pfts 02/09/24  >>>   reviewed the need and the process to achieve and maintain neg calorie balance > defer f/u primary care including intermittently monitoring thyroid status    Consider sleep medicine referral with f/u in this clinic prn to recertify meds  Each maintenance medication was reviewed in detail including emphasizing most importantly the difference between maintenance and prns and under what circumstances the prns are to be triggered using an action plan format where appropriate.  Total time for H and P, chart review, counseling, reviewing dpi/ 02/pulse ox  device(s) , directly observing portions of ambulatory 02 saturation study/ and generating customized AVS unique to this office visit / same day charting = 41 min final summary f/u ov

## 2024-02-17 NOTE — Assessment & Plan Note (Addendum)
 Stopped smoking  03/2021  with onset of ht dz at wt 264 lb - Echo 11/30/23 ok - maint on advair 250 as of initial pulmonary ov but overusing saba p exertion  - 12/10/2023 wt 302 desats walking > see ex hypoxemia  - PFT's  02/09/24  FEV1 1.54 (54 % ) ratio 0.80  p 0 % improvement from saba p advair prior to study with DLCO  15.0 (65%)   and FV curve min concave   and ERV 29% at wt 303     Most of his problem is clearly restrictive with disproportionate reduction in ERV c/w obesity, min airflow obst so rec  >>>> try change adfvair to 250 one puff daily for  few weeks then consider stopping it altogether

## 2024-02-17 NOTE — Assessment & Plan Note (Addendum)
 12/10/2023 at 302lb    Walked on RA  x  2  lap(s) =  approx 300  ft  @ mod pace, stopped due to desats to 87% improved to 92 % on 3lpm cont    but stopped due to Back pain  - 12/22/2023   Walked on RA  x  2  lap(s) =  approx 300  ft  @ slow to moderate pace, stopped due to sob  with lowest 02 sats 88% recovered on 2lpm and able to complete 3rd lap feeling much improve with lowest sats 93%  - 02/16/2024   desats on RA p 150 ft and required 2lp POC m to complete another 150 ft with sats 90% when stopped due to doe >>> rec POC titrated to sats > 90% and paced exercise/ wt loss

## 2024-02-19 NOTE — Telephone Encounter (Signed)
 Copied from CRM [id number]. Topic: Clinical - Medical Advice >> Feb 17, 2024 11:11 AM Devaughn RAMAN wrote: Reason for CRM: Pt was seen in the office on yesterday and Evaluated for portable oxygen and it was called into [facility], pt stated he does not understand why he needs to go to [facility] to get evaluated when he was evaluated on yesterday in the office. [facility] called him and told him he needed to go to [facility] to be evaluated, pt stated he asked [employer] why he needed to be evaluated and she stated pt just needs to come in. Pt would like to know why he needs to go to [place].   Called and spoke with [facility] to figure out why he needed to come [facility] to qualify and they stated that when he last qualified for poc he failed with them and that's why so I am sending LOV notes to [employer] [facility] hoping that will work - called and informed pt of the info I have received and that I will try the office notes hoping that will work.

## 2024-02-22 ENCOUNTER — Telehealth: Payer: Self-pay | Admitting: Internal Medicine

## 2024-02-22 NOTE — Telephone Encounter (Signed)
 RE: DME Received: 3 days ago Dakota Odom Dakota Odom; [name]; New, Bradley; Tucker, Dolanda; Cain, Mitchell Can we have provider update the RX using the O2 template for the POC. The current order is for a POC EVAL.  Thank you.       Previous Messages    ----- Message ----- From: Dakota Odom Sent: 02/17/2024   9:31 AM EST To: Dakota Odom; Ephraim Dollar* Subject: RE: DME                                        Received, Thank you ----- Message ----- From: Dakota Odom Sent: 02/17/2024   7:58 AM EST To: Dakota Odom; Ephraim Dollar* Subject: DME                                            DOB:Oct 25, 1955  Order Placed by .Dr. Ozell America  Please Advise.  Thank You  Dakota Odom  Please evaluate and titrate for best fit POC or portable O2 system. RT to evaluate and titrate patient for POC or Homefill with OCD maintain sats >/=90%. if patient qualifies, dispense POC or homefill with OCD 1-5 pulse dose. 2 L poc

## 2024-02-26 ENCOUNTER — Other Ambulatory Visit: Payer: Self-pay

## 2024-02-26 DIAGNOSIS — R0609 Other forms of dyspnea: Secondary | ICD-10-CM

## 2024-02-26 NOTE — Telephone Encounter (Signed)
 Sent new order in the correct format.

## 2024-02-29 ENCOUNTER — Ambulatory Visit: Admitting: Physician Assistant

## 2024-02-29 NOTE — Telephone Encounter (Signed)
 Copied from CRM [id number]. Topic: General - Other >> Feb 29, 2024  3:35 PM Benton O wrote: Reason for CRM: kristen calling from [employer] faxed over a request for chart notes and testing and just want to make sure it was received  Please sign and fax back . Please give them a call to let them know everything was received  [phone] kristen direct line and you can leave a message this a secure line  LVM for Kristen on secure line.  Faxing over requested documents.  Requested return call with questions or concerns

## 2024-03-01 NOTE — Telephone Encounter (Signed)
 Copied from CRM [id number]. Topic: Clinical - Lab/Test Results >> Mar 01, 2024 11:11 AM Corean SAUNDERS wrote: Reason for CRM: Josette from [employer] is inquiring about possible flow sheets or work sheets from patients 6 minute walk test on 11/4 and they are available please fax them at [phone]  For any questions please Josette at [phone] (secure line)  Flow sheets faxed to the above number

## 2024-03-14 ENCOUNTER — Ambulatory Visit: Admitting: Internal Medicine

## 2024-03-24 ENCOUNTER — Encounter: Payer: Self-pay | Admitting: Internal Medicine

## 2024-03-24 ENCOUNTER — Ambulatory Visit: Admitting: Internal Medicine

## 2024-03-24 VITALS — BP 158/80 | HR 70 | Ht 66.0 in | Wt 305.0 lb

## 2024-03-24 DIAGNOSIS — Z6841 Body Mass Index (BMI) 40.0 and over, adult: Secondary | ICD-10-CM

## 2024-03-24 DIAGNOSIS — R0902 Hypoxemia: Secondary | ICD-10-CM

## 2024-03-24 DIAGNOSIS — Z87891 Personal history of nicotine dependence: Secondary | ICD-10-CM

## 2024-03-24 DIAGNOSIS — R0609 Other forms of dyspnea: Secondary | ICD-10-CM

## 2024-03-24 NOTE — Patient Instructions (Addendum)
 We will call [facility] to provide you with humidity for your 02   Please schedule a follow up visit in 6  months but call sooner if needed

## 2024-03-24 NOTE — Assessment & Plan Note (Addendum)
 12/10/2023 at 302lb    Walked on RA  x  2  lap(s) =  approx 300  ft  @ mod pace, stopped due to desats to 87% improved to 92 % on 3lpm cont    but stopped due to Back pain  - 12/22/2023   Walked on RA  x  2  lap(s) =  approx 300  ft  @ slow to moderate pace, stopped due to sob  with lowest 02 sats 88% recovered on 2lpm and able to complete 3rd lap feeling much improve with lowest sats 93%  - 02/16/2024   desats on RA p 150 ft and required 2lp POC m to complete another 150 ft with sats 90% when stopped due to doe -  rec POC titrated to sats > 90% and paced exercise/ wt loss    Sats 91% on arrival for ov 03/24/2024  >>>  continue to titrate daily for sats > 90% but leave 02  3lpm hs since not a C02 retainer  >>> add humidity to concentrator circuit

## 2024-03-24 NOTE — Assessment & Plan Note (Addendum)
 PFT's  02/09/24  ERV 29% at wt 303    Body mass index is 49.23 kg/m.  -  trending up with wt now 305  Lab Results  Component Value Date   TSH 0.912 01/26/2024      Contributing to doe and risk of GERD/dvt/ PE  >>>   reviewed the need and the process to achieve and maintain neg calorie balance > defer f/u primary care including intermittently monitoring thyroid status          Each maintenance medication was reviewed in detail including emphasizing most importantly the difference between maintenance and prns and under what circumstances the prns are to be triggered using an action plan format where appropriate.  Total time for H and P, chart review, counseling, reviewing dpi/hfa/ 02 / pulse ox  device(s) and generating customized AVS unique to this office visit / same day charting = 31 min

## 2024-03-24 NOTE — Progress Notes (Addendum)
 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    ECHO  11/30/23 nl LV/RV/LA    History of Present Illness  12/10/2023  Pulmonary/ 1st office eval/ [place] [facility] advair 250 bid x feb 2025 but not consistent with it /technique  Chief Complaint  Patient presents with   Establish Care    Shob -   Dyspnea:  across the house  Cough: none  Sleep:  corner of couch x Jan 2023 SABA use: 8-10 times per day p overdoes it 02: none Rec To get the most out of exercise, you need to be continuously aware that you are short of breath, but never out of breath, for at least 30 minutes daily.     Make sure you check your oxygen saturation  AT  your highest level of activity (not after you stop)   to be sure it stays over 90%   Please schedule a follow up visit in 3 months but call sooner if needed with PFTS in meantime 1st available > not done as of 12/22/2023    12/22/2023  f/u ov/[place] office/Kattleya Kuhnert re: doe  maint on advair 250  and much less saba since last ov   Chief Complaint  Patient presents with   Shortness of Breath    O2 determination    Dyspnea:  ex 2-3 x daily  85% lowest sats  Cough: daytime dry  Sleeping: almost sitting straight up due to dyspnea flatter (baseline x Jan 2013     SABA use: much less than initial ov  02: none   Rec Patient Instructions  Make sure you check your oxygen saturation  AT  your highest level of activity (not after you stop)   to be sure it stays over 90%    LDSCT  01/28/24  RADS 2 / no emphysema    02/16/2024  f/u ov/[place] office/Izell Labat re: doe  maint on advair 250 one bid   Chief Complaint  Patient presents with   Shortness of Breath    POC eval  / can not carry o2 tank  shob   Dyspnea:  no longer shopping s scooter Cough: min dry sporadic ,mostly daytime Sleeping: about 60 degrees chair or couch and wakes up feeling good  SABA use: once or twice daily  02: not wearing at hs  Lung cancer screening: q October     03/24/2024  f/u ov/[place] office/Tayron Hunnell re: DOE/MO  maint on Advair 250   qam  Chief Complaint  Patient presents with   Shortness of Breath    F/u some mucus can produce    Dyspnea:  can't walk due to back uses cane  Cough: minimal dry cough  Sleeping: no change 60 degrees s    resp cc  SABA use: rarely using  02: 3lpm hs dry needs  - 2-3 lpm POC     No obvious day to day or daytime variability or assoc excess/ purulent sputum or mucus plugs or hemoptysis or cp or chest tightness, subjective wheeze or overt  hb symptoms.    Also denies any obvious fluctuation of symptoms with weather or environmental changes or other aggravating  or alleviating factors except as outlined above   No unusual exposure hx or h/o childhood pna/ asthma or knowledge of premature birth.  Current Allergies, Complete Past Medical History, Past Surgical History, Family History, and Social History were reviewed in [employer] record.  ROS  The following are not active complaints unless bolded Hoarseness, sore throat, dysphagia, dental problems, itching, sneezing,  nasal congestion or discharge of excess mucus or purulent secretions, ear ache,   fever, chills, sweats, unintended wt loss or wt gain, classically pleuritic or exertional cp,  orthopnea pnd or arm/hand swelling  or leg swelling, presyncope, palpitations, abdominal pain, anorexia, nausea, vomiting, diarrhea  or change in bowel habits or change in bladder habits, change in stools or change in urine, dysuria, hematuria,  rash, arthralgias, visual complaints, headache, numbness, weakness or ataxia or problems with walking or coordination,  change in mood or  memory.         Not sure of meds            Past Medical History:  Diagnosis Date   Anemia    new in 2023   CAD (coronary artery disease)    Chronic heart failure with preserved ejection fraction (HFpEF) (HCC)    Fungal infection of foot    Hypertension    MI (myocardial infarction) (HCC)    Morbid obesity (HCC)    Pre-diabetes       Objective:     Wts  03/24/2024      305  02/16/2024        298   12/22/23 300 lb (136.1 kg)  12/10/23 (!) 302 lb 3.2 oz (137.1 kg)  10/30/23 (!) 303 lb (137.4 kg)     Vital signs reviewed  03/24/2024  - Note at rest 02 sats  91% on RA   General appearance:    MO (by BMI) amb wm nad   HEENT : Oropharynx  clear      Nasal turbinates dry mucosa   NECK :  without  apparent JVD/ palpable Nodes/TM    LUNGS: no acc muscle use,  Nl contour chest which is clear to A and P bilaterally without cough on insp or exp maneuvers   CV:  RRR  no s3 or murmur or increase in P2, and severe lymphadema both LEs  ABD:  soft and nontender   MS:  walks with cane/ ext warm without deformities Or obvious joint restrictions  calf tenderness, cyanosis or clubbing    SKIN: warm and dry without lesions    NEURO:  alert, approp, nl sensorium with  no motor or cerebellar deficits apparent.   Assessment   Assessment & Plan DOE (dyspnea on exertion) Stopped smoking  03/2021  with onset of ht dz at wt 264 lb - Echo 11/30/23 ok - maint on advair 250 as of initial pulmonary ov but overusing saba p exertion  - 12/10/2023 wt 302 desats walking > see ex hypoxemia  - PFT's  02/09/24  FEV1 1.54 (54 % ) ratio 0.80  p 0 % improvement from saba p advair prior to study with DLCO  15.0 (65%)   and FV curve min concave and ERV 29% at wt 303     - 02/16/24 try decreaese Advair 250 to one daily since no airflow obst on prior pft > no change in symptoms or saba use as 03/24/2024 >>>    so ok to leave off if continues s change in ether parameter  His main problem  is MO not airways dz, so will continue to  simplify care where feasible    Exercise hypoxemia 12/10/2023 at 302lb    Walked on RA  x  2  lap(s) =  approx 300  ft  @ mod pace, stopped due to desats to 87% improved to 92 % on 3lpm cont    but stopped due to Back pain  - 12/22/2023   Walked on RA  x  2  lap(s) =  approx 300  ft  @ slow to moderate pace, stopped due to sob  with lowest 02 sats 88% recovered on 2lpm and able to complete 3rd lap feeling much improve with lowest sats 93%  - 02/16/2024   desats on RA p 150 ft and required 2lp POC m to complete another 150 ft with sats 90% when stopped due to doe -  rec POC titrated to sats > 90% and paced exercise/ wt loss    Sats 91% on arrival for ov 03/24/2024  >>>  continue to titrate daily for sats > 90% but leave 02  3lpm hs since not a C02 retainer  >>> add humidity to concentrator circuit   Morbid obesity (HCC) PFT's  02/09/24  ERV 29% at wt 303    Body mass index is 49.23 kg/m.  -  trending up with wt now 305  Lab Results  Component Value Date   TSH 0.912 01/26/2024      Contributing to doe and risk of GERD/dvt/ PE  >>>   reviewed the need and the process to achieve and maintain neg calorie balance > defer f/u primary care including intermittently monitoring thyroid status          Each maintenance medication was reviewed in detail including emphasizing most importantly the difference between maintenance and prns and under what circumstances the prns are to be triggered using an action plan format where appropriate.  Total time for H and P, chart review, counseling, reviewing dpi/hfa/ 02 / pulse ox  device(s) and generating customized AVS unique to this office visit / same day charting = 31 min            AVS  Patient Instructions  We will call adapt to provide you with humidity for your 02   Please schedule a follow up visit in 6  months but call sooner if needed      Ozell America, MD 03/24/2024

## 2024-03-24 NOTE — Assessment & Plan Note (Addendum)
 Stopped smoking  03/2021  with onset of ht dz at wt 264 lb - Echo 11/30/23 ok - maint on advair 250 as of initial pulmonary ov but overusing saba p exertion  - 12/10/2023 wt 302 desats walking > see ex hypoxemia  - PFT's  02/09/24  FEV1 1.54 (54 % ) ratio 0.80  p 0 % improvement from saba p advair prior to study with DLCO  15.0 (65%)   and FV curve min concave and ERV 29% at wt 303     - 02/16/24 try decreaese Advair 250 to one daily since no airflow obst on prior pft > no change in symptoms or saba use as 03/24/2024 >>>    so ok to leave off if continues s change in ether parameter  His main problem is MO not airways dz, so will continue to simplify care where feasible

## 2024-03-27 NOTE — Progress Notes (Unsigned)
 Cardiology Office Note    Date:  03/28/2024  ID:  Dakota Odom, DOB [birth year], MRN [id number] PCP:  Dakota Raina Elizabeth, NP  Cardiologist:  Dorn Lesches, MD  Electrophysiologist:  None   Chief Complaint: f/u cath  History of Present Illness: .    Dakota Odom is Odom 68 y.o. male with visit-pertinent history of CAD with inferior STEMI 03/2021 s/p DES to RCA with residual disease treated medically, HLD, former tobacco abuse, morbid obesity, lymphedema, borderline DM, HTN, mild pulmonary HTN and ?moderate AS by cath 01/2024, hepatic steatosis who is seen for follow-up.    He had an inferior STEMI 03/2021 with occluded RCA treated with overlapping DESx3. He had otherwise 70% distal LM without significant disease in LAD/LCx, recommended for medical therapy. 2D echo 03/2021 EF 60-65%, moderate LVH. I met him in 2023 when he was noting persistent DOE and LE edema. Patient deferred echo/sleep study at that time due to insurance issues. Labs demonstrated new microcytic anemia in the 10 range so he was sent to the ER to rule out GIB prior to loading with Plavix . FOBT was negative and he was discharged with recommendation for OP GI f/u. He was then switched to Plavix , with addition of Lasix /KCl for edema/HTN. He initially did not return for labwork or echo. He later followed up with Dakota Odom and Dr. Lesches. In 10/2023, he was still experiencing DOE. He was also being treated for cellulitis and lymphedema clinic. Nuclear stress test 10/2023 showed normal perfusion, EF 64%, low risk. 2D echo 11/2023 showed EF 55-60%, normal diastolic parameters, hyperdynamic RV, trivial MR. He saw pulmonology in August/September 2025 with desaturations with activity and prescribed O2 with exertion. I recently saw him back in the office 01/2024 at which time he was continuing to note progressive DOE and significant LE edema. Dr. Lesches recommended repeat R/L catheterization ultimately performed 01/2024 showing patent RCA  stents, mild progression of LM stenosis of 65%, 50-60% D1, 40% LAD, recommended for medical therapy, with relatively normal right heart cath pressures with borderline pulmonary HTN and moderate AS. He has also seen pulmonology with CT/PFTs with primary issue felt related to morbid obesity. He has an oxygen concentrator for home use.  He returns for follow-up doing about the same. He continues to have chronic DOE and leg edema unchanged from prior visit. No chest pain. He is following at wound center for right diabetic foot ulcer. He has declined sleep study. We [facility]. He wishes to hold off until after the holidays. Home BPs remain elevated, 140s-160s.  Labwork independently reviewed: 01/2024 CMET with AST 58, ALT 53, K 4.3, Cr 0.95, Hgb 12.5, plt OK, TSH OK 2023 LDL 66  ROS: .    Please see the history of present illness. All other systems are reviewed and otherwise negative.  Studies Reviewed: SABRA    EKG:  EKG is not ordered today  CV Studies: Cardiac studies reviewed are outlined and summarized above. Otherwise please see EMR for full report.   Current Reported Medications:.    Prior to Admission medications  Medication Sig Start Date End Date Taking? Authorizing Provider  albuterol  (VENTOLIN  HFA) 108 (90 Base) MCG/ACT inhaler Inhale 2 puffs into the lungs every 4 (four) hours as needed for shortness of breath or wheezing.   Yes [provider]  aspirin  EC 81 MG tablet Take 1 tablet (81 mg total) by mouth daily. 04/06/21  Yes Meng, Hao, PA  atorvastatin  (  LIPITOR ) 80 MG tablet Take 1 tablet (80 mg total) by mouth daily. 09/21/23  Yes Court Dorn PARAS, MD  cetirizine (ZYRTEC) 10 MG tablet Take 10 mg by mouth daily.   Yes [provider]  clopidogrel  (PLAVIX ) 75 MG tablet Take 1 tablet (75 mg total) by mouth daily. 11/13/23  Yes Court Dorn PARAS, MD  cyanocobalamin (VITAMIN B12) 1000 MCG tablet Take 2,000 mcg by mouth  daily.   Yes [provider]  ferrous sulfate  325 (65 FE) MG tablet Take 1 tablet (325 mg total) by mouth daily. 02/26/22  Yes Dakota Savant A, DO  fluticasone-salmeterol (ADVAIR) 250-50 MCG/ACT AEPB Inhale 1 puff into the lungs in the morning and at bedtime.   Yes [provider]  furosemide  (LASIX ) 20 MG tablet Take 20 mg by mouth daily.   Yes [provider]  JARDIANCE  10 MG TABS tablet TAKE (1) TABLET BY MOUTH DAILY 10/19/23  Yes Berry, Jonathan J, MD  metFORMIN (GLUCOPHAGE) 850 MG tablet Take 850 mg by mouth 2 (two) times daily with Odom meal.   Yes [provider]  metoprolol  succinate (TOPROL -XL) 25 MG 24 hr tablet Take 1 tablet (25 mg total) by mouth daily. 02/17/24  Yes Court Dorn PARAS, MD  Multiple Vitamin (MULTIVITAMIN WITH MINERALS) TABS tablet Take 1 tablet by mouth daily.   Yes [provider]  naproxen (NAPROSYN) 500 MG tablet Take 500 mg by mouth 2 (two) times daily with Odom meal. 03/09/23  Yes [provider]  nitroGLYCERIN  (NITROSTAT ) 0.4 MG SL tablet Place 1 tablet (0.4 mg total) under the tongue every 5 (five) minutes x 3 doses as needed for chest pain. 01/26/24  Yes Dakota Odom N, PA-C  potassium chloride  SA (KLOR-CON  M) 20 MEQ tablet Take 20 mEq by mouth daily.   Yes [provider]     Physical Exam:    VS:  BP (!) 140/82   Pulse 90   Ht 5' 6 (1.676 m)   Wt (!) 308 lb (139.7 kg)   SpO2 93%   BMI 49.71 kg/m    Wt Readings from Last 3 Encounters:  03/28/24 (!) 308 lb (139.7 kg)  03/24/24 (!) 305 lb (138.3 kg)  02/16/24 298 lb (135.2 kg)    GEN: Well nourished, well developed in no acute distress NECK: No JVD; No carotid bruits CARDIAC: RRR, soft SEM, no rubs or gallops RESPIRATORY:  Clear to auscultation without rales, wheezing or rhonchi  ABDOMEN: Soft, non-tender, non-distended EXTREMITIES:  Marked chronic stiff BLE edema; No acute deformity, difficult to palpate pedal pulses. Radial/brachial cath sites  well healed, no hematoma or ecchymosis; good pulse.   Asessement and Plan:.    1. Dyspnea on exertion, lymphedema - recent cath suggested stable findings, with fairly normal right heart pressures as well, arguing that lymphedema is likely multifactorial in setting of venous insufficiency, lymphedema and morbid obesity. He wishes to defer weight/wellness visit until after the holidays. We discussed that weight loss will be very important to multiple issues he is experiencing. I also suspect untreated sleep apnea. He is not willing to proceed with sleep study at this time. Of note he also has bilateral carpal tunnel syndrome. We discussed proceeding with cMRI to exclude infiltrative disease but he feels there is too much going on, would revisit in follow-up. We will focus on improved BP control in the meantime.  2. CAD, HLD - cath as outlined above. He remains on ASA/Plavix . I will check an updated CBC today and  plan to review plan for chronic DAPT with Dr. Court. Also discussed avoidance of NSAIDS with the patient given chronic naproxen use. He is due for lipid follow-up. He ate Odom biscuit this morning; has been challenging to catch Odom time when fasting in the past so will order direct LDL along with his lipid panel today. Will also f/u CMET given mildly abnormal LFTs recently.  3. Essential HTN - remains suboptimally controlled, did not recheck as we know recent readings 140-160s. Check CMET today to help guide med titration. Would probably move amlodipine down the line in preferences given chronic peripheral edema. Will plan close follow-up for this issue.  4. Morbid obesity - this remains crux of many issues. He has deferred [facility] referral for now, wants to try for after the holidays. Discussed importance of following up there as I think weight loss would positively impact many of his chronic conditions. He should be considered for GLP-1 and will also need concomitant lifestyle changes given dietary  habits.  5. Abnormal LFTs - recheck CMET today. Had hepatic steatosis on CT 01/2024. Encouraged continued f/u PCP.  6. Aortic stenosis by cath - not seen by echo 11/2023, but noted to be moderate on cath 01/2025. Consider repeat echocardiogram in 01/2025, can discuss closer to that time.  7. Right diabetic foot ulcer - being managed by PCP/wound care. Will order noninvasive LE arterial vascular testing to exclude PAD given DM, CAD. No clear claudication but sedentary.    Disposition: F/u with pharmD for HTN follow-up 2-4 weeks, me in 2-3 months.  Signed, Merl Guardino N Yaritzel Stange, PA-C

## 2024-03-28 ENCOUNTER — Encounter: Payer: Self-pay | Admitting: Physician Assistant

## 2024-03-28 ENCOUNTER — Ambulatory Visit: Attending: Physician Assistant | Admitting: Physician Assistant

## 2024-03-28 ENCOUNTER — Institutional Professional Consult (permissible substitution) (INDEPENDENT_AMBULATORY_CARE_PROVIDER_SITE_OTHER): Admitting: Adult Health

## 2024-03-28 VITALS — BP 140/82 | HR 90 | Ht 66.0 in | Wt 308.0 lb

## 2024-03-28 DIAGNOSIS — I890 Lymphedema, not elsewhere classified: Secondary | ICD-10-CM | POA: Diagnosis not present

## 2024-03-28 DIAGNOSIS — R0609 Other forms of dyspnea: Secondary | ICD-10-CM

## 2024-03-28 DIAGNOSIS — L97519 Non-pressure chronic ulcer of other part of right foot with unspecified severity: Secondary | ICD-10-CM

## 2024-03-28 DIAGNOSIS — D649 Anemia, unspecified: Secondary | ICD-10-CM

## 2024-03-28 DIAGNOSIS — E11621 Type 2 diabetes mellitus with foot ulcer: Secondary | ICD-10-CM | POA: Diagnosis not present

## 2024-03-28 DIAGNOSIS — R945 Abnormal results of liver function studies: Secondary | ICD-10-CM | POA: Diagnosis not present

## 2024-03-28 DIAGNOSIS — I10 Essential (primary) hypertension: Secondary | ICD-10-CM | POA: Diagnosis not present

## 2024-03-28 DIAGNOSIS — I2510 Atherosclerotic heart disease of native coronary artery without angina pectoris: Secondary | ICD-10-CM

## 2024-03-28 DIAGNOSIS — E785 Hyperlipidemia, unspecified: Secondary | ICD-10-CM | POA: Diagnosis not present

## 2024-03-28 DIAGNOSIS — I350 Nonrheumatic aortic (valve) stenosis: Secondary | ICD-10-CM | POA: Diagnosis not present

## 2024-03-28 NOTE — Patient Instructions (Addendum)
 Medication Instructions:  Your physician recommends that you continue on your current medications as directed. Please refer to the Current Medication list given to you today. *If you need a refill on your cardiac medications before your next appointment, please call your pharmacy*  Lab Work: TODAY-CMET, CBC, LIPIDS WITH DIRECT LDL If you have labs (blood work) drawn today and your tests are completely normal, you will receive your results only by: MyChart Message (if you have MyChart) OR A paper copy in the mail If you have any lab test that is abnormal or we need to change your treatment, we will call you to review the results.  Testing/Procedures: Your physician has requested that you have a lower extremity arterial exercise duplex. During this test, exercise and ultrasound are used to evaluate arterial blood flow in the legs. Allow one hour for this exam. There are no restrictions or special instructions.  Your physician has requested that you have an ankle brachial index (ABI). During this test an ultrasound and blood pressure cuff are used to evaluate the arteries that supply the arms and legs with blood. Allow thirty minutes for this exam. There are no restrictions or special instructions.  Please note: We ask at that you not bring children with you during ultrasound (echo/ vascular) testing. Due to room size and safety concerns, children are not allowed in the ultrasound rooms during exams. Our front office staff cannot provide observation of children in our lobby area while testing is being conducted. An adult accompanying a patient to their appointment will only be allowed in the ultrasound room at the discretion of the ultrasound technician under special circumstances. We apologize for any inconvenience.  Follow-Up: At [facility], you and your health needs are our priority.  As part of our continuing mission to provide you with exceptional heart care, our providers are all  part of one team.  This team includes your primary Cardiologist (physician) and Advanced Practice Providers or APPs (Physician Assistants and Nurse Practitioners) who all work together to provide you with the care you need, when you need it.  Your next appointment:   2-3 month(s)  Provider:   Raphael Bring, PA-C       NEEDS AN APPOINTMENT WITH PHARM-D IN 2-4 WEEKS FOR HTN.  We recommend signing up for the patient portal called MyChart.  Sign up information is provided on this After Visit Summary.  MyChart is used to connect with patients for Virtual Visits (Telemedicine).  Patients are able to view lab/test results, encounter notes, upcoming appointments, etc.  Non-urgent messages can be sent to your provider as well.   To learn more about what you can do with MyChart, go to [email or web address].   Other Instructions [facility] referral - Please call [phone] to make an appointment. All patients are required to attend a mandatory information session before starting with our clinic.  Monday - Thursday: 7:00 AM - 5:00 PM Friday - Sunday: Closed Closed: New Years Day, Memorial Day, July 4th, Labor Day, Thanksgiving Day, Christmas Day  Patients taking blood thinners should generally stay away from medicines like ibuprofen, Advil, Motrin, naproxen, and Aleve due to risk of stomach bleeding. You may take Tylenol  as directed or talk to your primary doctor about alternatives.

## 2024-03-29 LAB — COMPREHENSIVE METABOLIC PANEL WITH GFR
ALT: 57 IU/L — ABNORMAL HIGH (ref 0–44)
AST: 71 IU/L — ABNORMAL HIGH (ref 0–40)
Albumin: 4.3 g/dL (ref 3.9–4.9)
Alkaline Phosphatase: 94 IU/L (ref 47–123)
BUN/Creatinine Ratio: 13 (ref 10–24)
BUN: 12 mg/dL (ref 8–27)
Bilirubin Total: 0.4 mg/dL (ref 0.0–1.2)
CO2: 22 mmol/L (ref 20–29)
Calcium: 9.6 mg/dL (ref 8.6–10.2)
Chloride: 103 mmol/L (ref 96–106)
Creatinine, Ser: 0.95 mg/dL (ref 0.76–1.27)
Globulin, Total: 3.2 g/dL (ref 1.5–4.5)
Glucose: 90 mg/dL (ref 70–99)
Potassium: 4.5 mmol/L (ref 3.5–5.2)
Sodium: 144 mmol/L (ref 134–144)
Total Protein: 7.5 g/dL (ref 6.0–8.5)
eGFR: 87 mL/min/1.73 (ref >59)

## 2024-03-29 LAB — CBC
Hematocrit: 44.1 % (ref 37.5–51.0)
Hemoglobin: 13.4 g/dL (ref 13.0–17.7)
MCH: 24.3 pg — ABNORMAL LOW (ref 26.6–33.0)
MCHC: 30.4 g/dL — ABNORMAL LOW (ref 31.5–35.7)
MCV: 80 fL (ref 79–97)
Platelets: 246 x10E3/uL (ref 150–450)
RBC: 5.51 x10E6/uL (ref 4.14–5.80)
RDW: 15.6 % — ABNORMAL HIGH (ref 11.6–15.4)
WBC: 6.6 x10E3/uL (ref 3.4–10.8)

## 2024-03-29 LAB — LP+LDL DIRECT
Cholesterol, Total: 135 mg/dL (ref 100–199)
HDL: 42 mg/dL (ref >39)
LDL Chol Calc (NIH): 69 mg/dL (ref 0–99)
LDL Direct: 69 mg/dL (ref 0–99)
Triglycerides: 137 mg/dL (ref 0–149)
VLDL Cholesterol Cal: 24 mg/dL (ref 5–40)

## 2024-03-30 ENCOUNTER — Ambulatory Visit: Payer: Self-pay | Admitting: Physician Assistant

## 2024-03-30 DIAGNOSIS — D649 Anemia, unspecified: Secondary | ICD-10-CM

## 2024-03-30 DIAGNOSIS — R945 Abnormal results of liver function studies: Secondary | ICD-10-CM

## 2024-03-30 DIAGNOSIS — I2510 Atherosclerotic heart disease of native coronary artery without angina pectoris: Secondary | ICD-10-CM

## 2024-03-30 DIAGNOSIS — I890 Lymphedema, not elsewhere classified: Secondary | ICD-10-CM

## 2024-03-30 DIAGNOSIS — E11621 Type 2 diabetes mellitus with foot ulcer: Secondary | ICD-10-CM

## 2024-03-30 DIAGNOSIS — E785 Hyperlipidemia, unspecified: Secondary | ICD-10-CM

## 2024-03-30 DIAGNOSIS — I350 Nonrheumatic aortic (valve) stenosis: Secondary | ICD-10-CM

## 2024-03-30 DIAGNOSIS — I10 Essential (primary) hypertension: Secondary | ICD-10-CM

## 2024-03-30 DIAGNOSIS — R0609 Other forms of dyspnea: Secondary | ICD-10-CM

## 2024-03-30 MED ORDER — IRBESARTAN 150 MG PO TABS: 150.0000 mg | ORAL_TABLET | Freq: Every day | ORAL | 3 refills | Status: AC

## 2024-04-05 LAB — BASIC METABOLIC PANEL WITH GFR
BUN/Creatinine Ratio: 13 (ref 10–24)
BUN: 14 mg/dL (ref 8–27)
CO2: 21 mmol/L (ref 20–29)
Calcium: 9.1 mg/dL (ref 8.6–10.2)
Chloride: 103 mmol/L (ref 96–106)
Creatinine, Ser: 1.04 mg/dL (ref 0.76–1.27)
Glucose: 132 mg/dL — ABNORMAL HIGH (ref 70–99)
Potassium: 4.7 mmol/L (ref 3.5–5.2)
Sodium: 143 mmol/L (ref 134–144)
eGFR: 78 mL/min/1.73

## 2024-04-11 ENCOUNTER — Encounter (HOSPITAL_COMMUNITY): Payer: Self-pay

## 2024-04-11 ENCOUNTER — Ambulatory Visit (HOSPITAL_BASED_OUTPATIENT_CLINIC_OR_DEPARTMENT_OTHER)
Admission: RE | Admit: 2024-04-11 | Discharge: 2024-04-11 | Disposition: A | Discharge status: Home / Self Care | Source: Ambulatory Visit | Attending: Physician Assistant | Admitting: Physician Assistant

## 2024-04-11 ENCOUNTER — Ambulatory Visit (HOSPITAL_COMMUNITY)
Admission: RE | Admit: 2024-04-11 | Discharge: 2024-04-11 | Disposition: A | Discharge status: Home / Self Care | Source: Ambulatory Visit | Attending: Physician Assistant | Admitting: Physician Assistant

## 2024-04-11 DIAGNOSIS — E785 Hyperlipidemia, unspecified: Secondary | ICD-10-CM

## 2024-04-11 DIAGNOSIS — R0609 Other forms of dyspnea: Secondary | ICD-10-CM | POA: Diagnosis not present

## 2024-04-11 DIAGNOSIS — R945 Abnormal results of liver function studies: Secondary | ICD-10-CM

## 2024-04-11 DIAGNOSIS — I890 Lymphedema, not elsewhere classified: Secondary | ICD-10-CM | POA: Diagnosis not present

## 2024-04-11 DIAGNOSIS — I10 Essential (primary) hypertension: Secondary | ICD-10-CM | POA: Insufficient documentation

## 2024-04-11 DIAGNOSIS — I2510 Atherosclerotic heart disease of native coronary artery without angina pectoris: Secondary | ICD-10-CM | POA: Insufficient documentation

## 2024-04-11 DIAGNOSIS — L97519 Non-pressure chronic ulcer of other part of right foot with unspecified severity: Secondary | ICD-10-CM | POA: Insufficient documentation

## 2024-04-11 DIAGNOSIS — E11621 Type 2 diabetes mellitus with foot ulcer: Secondary | ICD-10-CM

## 2024-04-11 DIAGNOSIS — D649 Anemia, unspecified: Secondary | ICD-10-CM | POA: Diagnosis not present

## 2024-04-11 DIAGNOSIS — L97518 Non-pressure chronic ulcer of other part of right foot with other specified severity: Secondary | ICD-10-CM | POA: Insufficient documentation

## 2024-04-11 DIAGNOSIS — I350 Nonrheumatic aortic (valve) stenosis: Secondary | ICD-10-CM

## 2024-04-11 LAB — VAS US ABI WITH/WO TBI
Left ABI: 1.25
Right ABI: 1.05

## 2024-04-11 NOTE — Progress Notes (Signed)
Left message for call back regarding lab results

## 2024-04-24 ENCOUNTER — Other Ambulatory Visit: Payer: Self-pay | Admitting: Cardiovascular Disease

## 2024-04-29 ENCOUNTER — Encounter: Payer: Self-pay | Admitting: Internal Medicine

## 2024-05-09 ENCOUNTER — Ambulatory Visit: Admitting: Pharmacist Clinician (PhC)/ Clinical Pharmacy Specialist

## 2024-05-16 ENCOUNTER — Encounter (INDEPENDENT_AMBULATORY_CARE_PROVIDER_SITE_OTHER): Payer: Self-pay

## 2024-05-17 ENCOUNTER — Telehealth: Payer: Self-pay | Admitting: *Deleted

## 2024-05-17 NOTE — Telephone Encounter (Signed)
 Patient called back about Mychart message for rescheduling on Thursday with Dr. Cindie ---He will wait until we know what other days he may give us  to catch him up or when I can give you March and hopefully April this week.

## 2024-05-19 ENCOUNTER — Ambulatory Visit: Admitting: Internal Medicine

## 2024-05-26 ENCOUNTER — Ambulatory Visit: Admitting: Internal Medicine

## 2024-06-14 ENCOUNTER — Ambulatory Visit: Admitting: Pharmacist

## 2024-06-29 ENCOUNTER — Ambulatory Visit: Admitting: Physician Assistant
